# Patient Record
Sex: Female | Born: 1965 | Race: Black or African American | Hispanic: No | Smoking: Former smoker
Health system: Southern US, Community
[De-identification: ages and names within clinical notes are randomized; demographics above are authoritative.]

## PROBLEM LIST (undated history)

## (undated) DIAGNOSIS — M349 Systemic sclerosis, unspecified: Secondary | ICD-10-CM

## (undated) HISTORY — DX: Systemic sclerosis, unspecified: M34.9

---

## 2003-03-18 ENCOUNTER — Ambulatory Visit (HOSPITAL_COMMUNITY): Admission: RE | Admit: 2003-03-18 | Discharge: 2003-03-19 | Payer: Self-pay | Admitting: Orthopaedic Surgery

## 2003-04-10 HISTORY — PX: CERVICAL DISC SURGERY: SHX588

## 2006-06-29 ENCOUNTER — Emergency Department (HOSPITAL_COMMUNITY): Admission: EM | Admit: 2006-06-29 | Discharge: 2006-06-29 | Payer: Self-pay | Admitting: Emergency Medicine

## 2008-12-22 ENCOUNTER — Other Ambulatory Visit: Admission: RE | Admit: 2008-12-22 | Discharge: 2008-12-22 | Payer: Self-pay | Admitting: Family Medicine

## 2008-12-22 ENCOUNTER — Ambulatory Visit: Payer: Self-pay | Admitting: Family Medicine

## 2008-12-22 ENCOUNTER — Encounter: Payer: Self-pay | Admitting: Family Medicine

## 2008-12-22 DIAGNOSIS — M349 Systemic sclerosis, unspecified: Secondary | ICD-10-CM

## 2008-12-22 LAB — HM PAP SMEAR

## 2008-12-23 LAB — CONVERTED CEMR LAB: Pap Smear: NORMAL

## 2009-01-03 ENCOUNTER — Encounter: Payer: Self-pay | Admitting: Family Medicine

## 2009-01-03 ENCOUNTER — Encounter: Admission: RE | Admit: 2009-01-03 | Discharge: 2009-01-03 | Payer: Self-pay | Admitting: Family Medicine

## 2009-01-03 LAB — HM MAMMOGRAPHY: HM Mammogram: NEGATIVE

## 2009-01-04 LAB — CONVERTED CEMR LAB
ALT: 16 units/L (ref 0–35)
AST: 16 units/L (ref 0–37)
Albumin: 4.2 g/dL (ref 3.5–5.2)
Alkaline Phosphatase: 71 units/L (ref 39–117)
BUN: 16 mg/dL (ref 6–23)
CO2: 20 meq/L (ref 19–32)
Calcium: 9.1 mg/dL (ref 8.4–10.5)
Chloride: 110 meq/L (ref 96–112)
Cholesterol: 165 mg/dL (ref 0–200)
Creatinine, Ser: 0.8 mg/dL (ref 0.40–1.20)
Glucose, Bld: 96 mg/dL (ref 70–99)
HDL: 61 mg/dL (ref >39)
LDL Cholesterol: 95 mg/dL (ref 0–99)
Pap Smear: NORMAL
Potassium: 4.5 meq/L (ref 3.5–5.3)
Sodium: 141 meq/L (ref 135–145)
TSH: 1.21 microintl units/mL (ref 0.350–4.500)
Total Bilirubin: 0.5 mg/dL (ref 0.3–1.2)
Total CHOL/HDL Ratio: 2.7
Total Protein: 7.2 g/dL (ref 6.0–8.3)
Triglycerides: 46 mg/dL (ref <150)
VLDL: 9 mg/dL (ref 0–40)

## 2009-08-10 ENCOUNTER — Ambulatory Visit: Payer: Self-pay | Admitting: Family Medicine

## 2009-08-10 DIAGNOSIS — L301 Dyshidrosis [pompholyx]: Secondary | ICD-10-CM | POA: Insufficient documentation

## 2009-08-11 LAB — CONVERTED CEMR LAB
ALT: 17 units/L (ref 0–35)
AST: 16 units/L (ref 0–37)
Albumin: 3.8 g/dL (ref 3.5–5.2)
Alkaline Phosphatase: 63 units/L (ref 39–117)
BUN: 19 mg/dL (ref 6–23)
Basophils Absolute: 0 10*3/uL (ref 0.0–0.1)
Basophils Relative: 0 % (ref 0–1)
CO2: 23 meq/L (ref 19–32)
Calcium: 8.9 mg/dL (ref 8.4–10.5)
Chloride: 108 meq/L (ref 96–112)
Creatinine, Ser: 0.84 mg/dL (ref 0.40–1.20)
Eosinophils Absolute: 0.1 10*3/uL (ref 0.0–0.7)
Eosinophils Relative: 1 % (ref 0–5)
Glucose, Bld: 80 mg/dL (ref 70–99)
HCT: 45.6 % (ref 36.0–46.0)
Hemoglobin: 15 g/dL (ref 12.0–15.0)
INR: 1.09 (ref <1.50)
Lymphocytes Relative: 26 % (ref 12–46)
Lymphs Abs: 2.2 10*3/uL (ref 0.7–4.0)
MCHC: 32.9 g/dL (ref 30.0–36.0)
MCV: 89.8 fL (ref 78.0–100.0)
Monocytes Absolute: 0.8 10*3/uL (ref 0.1–1.0)
Monocytes Relative: 9 % (ref 3–12)
Neutro Abs: 5.4 10*3/uL (ref 1.7–7.7)
Neutrophils Relative %: 64 % (ref 43–77)
Platelets: 246 10*3/uL (ref 150–400)
Potassium: 3.9 meq/L (ref 3.5–5.3)
Prothrombin Time: 14 s (ref 11.6–15.2)
RBC: 5.08 M/uL (ref 3.87–5.11)
RDW: 14.1 % (ref 11.5–15.5)
Sodium: 140 meq/L (ref 135–145)
TSH: 0.9 microintl units/mL (ref 0.350–4.500)
Total Bilirubin: 0.4 mg/dL (ref 0.3–1.2)
Total Protein: 6.6 g/dL (ref 6.0–8.3)
WBC: 8.5 10*3/uL (ref 4.0–10.5)

## 2009-08-18 ENCOUNTER — Encounter: Payer: Self-pay | Admitting: Family Medicine

## 2010-01-09 ENCOUNTER — Ambulatory Visit: Payer: Self-pay | Admitting: Family Medicine

## 2010-01-09 DIAGNOSIS — F430 Acute stress reaction: Secondary | ICD-10-CM | POA: Insufficient documentation

## 2010-01-09 DIAGNOSIS — M79609 Pain in unspecified limb: Secondary | ICD-10-CM | POA: Insufficient documentation

## 2010-01-10 ENCOUNTER — Encounter: Payer: Self-pay | Admitting: Family Medicine

## 2010-01-18 ENCOUNTER — Ambulatory Visit: Payer: Self-pay

## 2010-01-18 ENCOUNTER — Ambulatory Visit: Payer: Self-pay | Admitting: Cardiology

## 2010-02-20 ENCOUNTER — Ambulatory Visit: Payer: Self-pay | Admitting: Family Medicine

## 2010-04-20 ENCOUNTER — Ambulatory Visit
Admission: RE | Admit: 2010-04-20 | Discharge: 2010-04-20 | Payer: Self-pay | Source: Home / Self Care | Attending: Family Medicine | Admitting: Family Medicine

## 2010-04-26 ENCOUNTER — Encounter: Payer: Self-pay | Admitting: Family Medicine

## 2010-04-28 ENCOUNTER — Ambulatory Visit
Admission: RE | Admit: 2010-04-28 | Discharge: 2010-04-28 | Payer: Self-pay | Source: Home / Self Care | Attending: Internal Medicine | Admitting: Internal Medicine

## 2010-05-01 ENCOUNTER — Encounter: Payer: Self-pay | Admitting: Internal Medicine

## 2010-05-02 ENCOUNTER — Ambulatory Visit (HOSPITAL_COMMUNITY): Admission: RE | Admit: 2010-05-02 | Payer: Self-pay | Source: Home / Self Care | Admitting: Rheumatology

## 2010-05-02 ENCOUNTER — Ambulatory Visit: Admit: 2010-05-02 | Payer: Self-pay

## 2010-05-08 ENCOUNTER — Encounter: Payer: Self-pay | Admitting: Family Medicine

## 2010-05-09 NOTE — Assessment & Plan Note (Signed)
Summary: follow-up mood   Vital Signs:  Patient profile:   44 year old female Height:      66 inches Weight:      171 pounds BMI:     27.70 Pulse rate:   61 / minute BP sitting:   117 / 72  (right arm) Cuff size:   regular  Vitals Entered By: Avon Gully CMA, Duncan Dull) (February 20, 2010 8:33 AM) CC: f/u mood, feels much better   Primary Care Provider:  Nani Gasser MD  CC:  f/u mood and feels much better.  History of Present Illness: Has been donig well on the citalopram.   Much less stressed. anger is much better.  Sleeping well overall. Hasn't had to use the benzo at all. No SE of the medication.Says her mom and coworkers have really noticed a difference. No change in her appetite.   Current Medications (verified): 1)  Triamcinolone Acetonide 0.5 % Oint (Triamcinolone Acetonide) .... Apply To Fingertip 1-2 X A Day 2)  Citalopram Hydrobromide 20 Mg Tabs (Citalopram Hydrobromide) .... 1/2 Tab By Mouth Daily For One Week, Then Inc To 1 Tab Daily 3)  Klonopin 0.5 Mg Tabs (Clonazepam) .... 1/2 To 1 Tab Once Daily Tab As Needed For Anxiety  Allergies (verified): No Known Drug Allergies  Comments:  Nurse/Medical Assistant: The patient's medications and allergies were reviewed with the patient and were updated in the Medication and Allergy Lists. Avon Gully CMA, Duncan Dull) (February 20, 2010 8:34 AM)  Physical Exam  General:  Well-developed,well-nourished,in no acute distress; alert,appropriate and cooperative throughout examination Lungs:  Normal respiratory effort, chest expands symmetrically. Lungs are clear to auscultation, no crackles or wheezes. Heart:  Normal rate and regular rhythm. S1 and S2 normal without gallop, murmur, click, rub or other extra sounds. Skin:  no rashes.   Psych:  Cognition and judgment appear intact. Alert and cooperative with normal attention span and concentration. No apparent delusions, illusions, hallucinations   Impression &  Recommendations:  Problem # 1:  STRESS REACTION, ACUTE (ICD-308.9) Doing very very well.  Continue current regimen.   Call if any concernes F/U in 2-3 months. Reminded her med has to be weaned if she decides to discontinue it.   Complete Medication List: 1)  Triamcinolone Acetonide 0.5 % Oint (Triamcinolone acetonide) .... Apply to fingertip 1-2 x a day 2)  Citalopram Hydrobromide 20 Mg Tabs (Citalopram hydrobromide) .... Take 1 tablet by mouth once a day 3)  Klonopin 0.5 Mg Tabs (Clonazepam) .... 1/2 to 1 tab once daily tab as needed for anxiety  Patient Instructions: 1)  Please schedule a follow-up appointment in 2-3  months for mood.   Contraindications/Deferment of Procedures/Staging:    Test/Procedure: FLU VAX    Reason for deferment: patient declined  Prescriptions: CITALOPRAM HYDROBROMIDE 20 MG TABS (CITALOPRAM HYDROBROMIDE) Take 1 tablet by mouth once a day  #90 x 0   Entered and Authorized by:   Nani Gasser MD   Signed by:   Nani Gasser MD on 02/20/2010   Method used:   Electronically to        [facility]. [phone]* (retail)       [phone]       [place], [place]  [address]       Ph: [phone]       Fax: [phone]   RxID:   [id number]    Orders Added: 1)  Est. Patient Level III [02725]

## 2010-05-09 NOTE — Assessment & Plan Note (Signed)
Summary: Lipoma, eczema, nosebleeds, wants podiatry referrall.    Vital Signs:  Patient profile:   45 year old female Height:      66 inches Weight:      176 pounds Pulse rate:   87 / minute BP sitting:   113 / 62  (left arm) Cuff size:   regular  Vitals Entered By: Kathlene November (Aug 10, 2009 8:36 AM) CC: ?cyst on back   Primary Care Provider:  Nani Gasser MD  CC:  ?cyst on back.  History of Present Illness: ?cyst on back. Noticed it several months ago.  Not painful or otherwise bothersome.   Started with nosebleeds about 2 months ago. NO HA or nasal pain or sinus congestions or fever.  No nose trauma.   Upper arms have been dry and burning.  Has an infection in her first finger right hand. Says she soaked it in peroxide and is now better but still healing. She has been putting neosporin on it. Marland Kitchen Up 3 lbs in the last 6 months. Had normal thyroid level in September. She is a smoker.    Current Medications (verified): 1)  None  Allergies (verified): No Known Drug Allergies  Comments:  Nurse/Medical Assistant: The patient's medications and allergies were reviewed with the patient and were updated in the Medication and Allergy Lists. Kathlene November (Aug 10, 2009 8:36 AM)  Social History: Reviewed history from 12/22/2008 and no changes required. 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  14 yrs education. LIves with her mother.   Current Smoker Alcohol use-yes Drug use-no Regular exercise-no  Physical Exam  General:  Well-developed,well-nourished,in no acute distress; alert,appropriate and cooperative throughout examination Head:  Normocephalic and atraumatic without obvious abnormalities. No apparent alopecia or balding. Lungs:  Normal respiratory effort, chest expands symmetrically. Lungs are clear to auscultation, no crackles or wheezes. Heart:  Normal rate and regular rhythm. S1 and S2 normal without gallop, murmur, click, rub or other extra sounds. Skin:   Right upper back with 6 cm lipoma. Nontender and no rash.  Dry peeling skin on her fingertips.  Right hand frist finger with cracking and a scab.  Appears to be healing well.  Psych:  Cognition and judgment appear intact. Alert and cooperative with normal attention span and concentration. No apparent delusions, illusions, hallucinations   Impression & Recommendations:  Problem # 1:  LIPOMA (ICD-214.9) GAve reassurance.  Discussed options or watching it or scheudle for General surgery for removal.  Pt wants to think about it.  Told her to call me if wants the referral.   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 Call if not better in 2-3 weeks.   Problem # 3:  NOSEBLEED (ICD-784.7) Will get labs to rule out any underlying liver d/o or elevated PT or white count.  Orders: T-CBC w/Diff [id number]) T-TSH [id number]) T-Comprehensive Metabolic Panel [id number]) T-Protime, Auto ([id number])  Complete Medication List: 1)  Triamcinolone Acetonide 0.5 % Oint (Triamcinolone acetonide) .... Apply to fingertip 1-2 x a day  Other Orders: Podiatry Referral (Podiatry)  Patient Instructions: 1)  We will call you with your lab results and with your referral to Podiatry.  Prescriptions: TRIAMCINOLONE ACETONIDE 0.5 % OINT (TRIAMCINOLONE ACETONIDE) Apply to fingertip 1-2 x a day  #40 grams. x 1   Entered and Authorized by:   Nani Gasser MD   Signed by:   Nani Gasser MD on 08/10/2009   Method used:   Electronically  to        [facility]. [phone]* (retail)       [phone]       [place], [place]  [address]       Ph: [phone]       Fax: [phone]   RxID:   [id number]

## 2010-05-09 NOTE — Assessment & Plan Note (Signed)
Summary: Mood, stress, Neck and arm pain   Vital Signs:  Patient profile:   45 year old female Height:      66 inches Weight:      176 pounds Pulse rate:   69 / minute BP sitting:   97 / 53  (right arm) Cuff size:   regular  Vitals Entered By: Avon Gully CMA, Duncan Dull) (January 09, 2010 11:14 AM)  Contraindications/Deferment of Procedures/Staging:    Test/Procedure: FLU VAX    Reason for deferment: patient declined  CC: nosebleed and spit up blood, HA's every day during the month of aug.stressed at work   Primary Care [place] Hearty:  Nani Gasser MD  CC:  nosebleed and spit up blood and HA's every day during the month of aug.stressed at work.  History of Present Illness: nosebleed and spit up blood, HA's every day during the month of aug.stressed at work.  Last week had a severe pain in her left side of her neck and radiates down into her left arm. Stopped the car and bought a bottle of Aleve and that seemed to help it. Says it lasted about 10 minutes or so, but not sure.  Has never happened before. Has not happened since.  No chest pain or diaphoresis at that time.  Feels less mentally clear. Having nosebleeds only on the left nostril. Can hapen anytime.  No warning. Can happen at rest. Maybe happening a few times a month. Had a HA the whole month of August. No HA the month of September.  Period has been irregular the last few months.  No weight changes.  Says she is feeling very stressed at work. Having a hard time really dealing with the stressors. Says she doesn't feel down or particularly anxious, just overwhelmed. She says she knows ultimatley needs to find a new job.  Says even her mother 2 months ago told her she was "High-strung". Says she has really thought about it and thinks she probably is.   Current Medications (verified): 1)  Triamcinolone Acetonide 0.5 % Oint (Triamcinolone Acetonide) .... Apply To Fingertip 1-2 X A Day  Allergies (verified): No Known Drug  Allergies  Comments:  Nurse/Medical Assistant: The patient's medications and allergies were reviewed with the patient and were updated in the Medication and Allergy Lists. Avon Gully CMA, Duncan Dull) (January 09, 2010 11:15 AM)  Past History:  Past Medical History: Last updated: 12/22/2008 Dr. 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  14 yrs education. LIves with her mother.   Current Smoker Alcohol use-yes Drug use-no Regular exercise-no  Family History: Mother with BrCA Father with died  MI in his early 82s,  GM with DM  Physical Exam  General:  Well-developed,well-nourished,in no acute distress; alert,appropriate and cooperative throughout examination Head:  Normocephalic and atraumatic without obvious abnormalities. No apparent alopecia or balding. Eyes:  No corneal or conjunctival inflammation noted. EOMI. Perrla.  Ears:  External ear exam shows no significant lesions or deformities.  Otoscopic examination reveals clear canals, tympanic membranes are intact bilaterally without bulging, retraction, inflammation or discharge. Hearing is grossly normal bilaterally. Nose:  External nasal examination shows no deformity or inflammation. Nasal mucosa are pink and moist without lesions or exudates. Mouth:  Oral mucosa and oropharynx without lesions or exudates.  Teeth in good repair. Neck:  No deformities, masses, or tenderness noted. Lungs:  Normal respiratory effort, chest  expands symmetrically. Lungs are clear to auscultation, no crackles or wheezes. Heart:  Normal rate and regular rhythm. S1 and S2 normal without gallop, murmur, click, rub or other extra sounds. Pulses:  Radial 2  Neurologic:  alert & oriented X3, cranial nerves II-XII intact, and gait normal.   Skin:  no rashes.   Cervical Nodes:  No lymphadenopathy  noted Psych:  Cognition and judgment appear intact. Alert and cooperative with normal attention span and concentration. No apparent delusions, illusions, hallucinations   Impression & Recommendations:  Problem # 1:  NOSEBLEED (ICD-784.7)  No lesions on exam. Labs in May were normal. Lets refer to ENT since nosebleds are only from the left nostril to rule out a lesion that may be bleeding.  She doesn' feel her tissues are dry at all.   Orders: ENT Referral (ENT)  Problem # 2:  ISCHEMIC HEART DISEASE, PREMATURE, FAMILY HX (ICD-V17.3)  With arm Pain and family hx of preamature heart dz will schedule for stress test.  EKG shows NSR, rate 60 bpm, No acute changes  Will refer for stress testing.   Dsicused the need for smoking cessation. she is agrrees but is not ready to quit.  Orders: T-Treadmill interpretation and report only (93716) EKG w/ Interpretation (93000)  Problem # 3:  STRESS REACTION, ACUTE (ICD-308.9) Discussed tx with counseling and medications. Discussed SSRI and benzos. Discussed potential SE and addicitve potential for benzos. Discussed onm ly using benzos as a rescue medication.  F/U in 1 months.   Complete Medication List: 1)  Triamcinolone Acetonide 0.5 % Oint (Triamcinolone acetonide) .... Apply to fingertip 1-2 x a day 2)  Citalopram Hydrobromide 20 Mg Tabs (Citalopram hydrobromide) .... 1/2 tab by mouth daily for one week, then inc to 1 tab daily 3)  Klonopin 0.5 Mg Tabs (Clonazepam) .... 1/2 to 1 tab once daily tab as needed for anxiety  Patient Instructions: 1)  Start citalpram for mood.  Can cause mild Headaches and shakiness the first week. Usually resolves after first 1-2 weeks 2)  Can use the clonazepam as a rescue medicine when feeling overwhelmed.  3)  Will refer for a stress test. 4)  Please schedule a follow-up appointment in 1 month.  Prescriptions: CITALOPRAM HYDROBROMIDE 20 MG TABS (CITALOPRAM HYDROBROMIDE) 1/2 tab by mouth daily for one week, then  inc to 1 tab daily  #30 x 0   Entered and Authorized by:   Nani Gasser MD   Signed by:   Nani Gasser MD on 01/09/2010   Method used:   Electronically to        [facility] [phone]* (retail)       [address]       [place], [place]  [address]       Ph: [phone]       Fax: [phone]   RxID:   [id number] TRIAMCINOLONE ACETONIDE 0.5 % OINT (TRIAMCINOLONE ACETONIDE) Apply to fingertip 1-2 x a day  #40 grams. x 1   Entered and Authorized by:   Nani Gasser MD   Signed by:   Nani Gasser MD on 01/09/2010   Method used:   Electronically to        [facility] [phone]* (retail)       [address] [place], [place]  [address]       Ph: [phone]       Fax: [phone]   RxID:   [id number] KLONOPIN 0.5 MG  TABS (CLONAZEPAM) 1/2 to 1 tab once daily tab as needed for anxiety  #15 x 0   Entered and Authorized by:   Nani Gasser MD   Signed by:   Nani Gasser MD on 01/09/2010   Method used:   Printed then faxed to ...       [facility]. [phone]* (retail)       [phone] [address]       [place], [place]  [address]       Ph: [phone]       Fax: [phone]   RxID:   [id number] CITALOPRAM HYDROBROMIDE 20 MG TABS (CITALOPRAM HYDROBROMIDE) 1/2 tab by mouth daily for one week, then inc to 1 tab daily  #30 x 0   Entered and Authorized by:   Nani Gasser MD   Signed by:   Nani Gasser MD on 01/09/2010   Method used:   Electronically to        [facility]. [phone]* (retail)       [phone] [address]       [place], [place]  [address]       Ph: [phone]       Fax: [phone]   RxID:   [id number]

## 2010-05-09 NOTE — Consult Note (Signed)
Summary: [facility]  [facility]   Imported By: Lanelle Bal 09/30/2009 08:33:17  _____________________________________________________________________  External Attachment:    Type:   Image     Comment:   External Document

## 2010-05-09 NOTE — Consult Note (Signed)
Summary: [facility]  [facility]   Imported By: Lanelle Bal 01/27/2010 14:49:40  _____________________________________________________________________  External Attachment:    Type:   Image     Comment:   External Document

## 2010-05-10 ENCOUNTER — Other Ambulatory Visit (HOSPITAL_COMMUNITY): Payer: Self-pay

## 2010-05-11 NOTE — Miscellaneous (Signed)
Summary: Orders Update pft charges   Clinical Lists Changes  Orders: Added new Service order of Carbon Monoxide diffusing w/capacity (94729) - Signed Added new Service order of Lung Volumes/Gas dilution or washout (94727) - Signed Added new Service order of Spirometry (Pre & Post) (94060) - Signed 

## 2010-05-11 NOTE — Assessment & Plan Note (Signed)
Summary: Stress reaction, scleroderma   Vital Signs:  Patient profile:   45 year old female Height:      66 inches Weight:      178 pounds Pulse rate:   68 / minute BP sitting:   123 / 74  (right arm) Cuff size:   regular  Vitals Entered By: Avon Gully CMA, Duncan Dull) (April 20, 2010 4:16 PM) CC: Note for seatbelt, hands peeling, needs chair for work   Primary Care Provider:  Nani Gasser MD  CC:  Note for seatbelt, hands peeling, and needs chair for work.  History of Present Illness: Note for seatbelt, hands peeling, needs chair for work.  Needs an ergonomic chair for work. Current chair causes thigh, buttock and low back discomfort. She does have a hx of scleroderma that primarly affects her hands and feet. She has been so frustrated with her current chair she went out and bought her own.    Ran out of the citalopram and just stopped it. Says even though she was tolerating it well she thinks she is fine on it.   She says when she wears a seatbelt she feels claustrophobic so she just doesn't wear it and gets tickets.  She wants a note sayting she doesn't have to wear her seatbelt.   Allergies: No Known Drug Allergies  Physical Exam  General:  Well-developed,well-nourished,in no acute distress; alert,appropriate and cooperative throughout examination Lungs:  Normal respiratory effort, chest expands symmetrically. Lungs are clear to auscultation, no crackles or wheezes. Heart:  Normal rate and regular rhythm. S1 and S2 normal without gallop, murmur, click, rub or other extra sounds. Skin:  Her fingers looks tight and shiney. She has cracks at fingertips.    Impression & Recommendations:  Problem # 1:  STRESS REACTION, ACUTE (ICD-308.9) She feels she is doing well off the citalopram and would like to stay off of it.  I am not personally convinced of these as she almost started crying several times during the office visit but I will respect her wishes. Also as far  as the seatbelt is concerned I explained that I will not sign s letter stating she doens't have to wear her seatbelt as I think this is unsafe and if this is really a phobia she could get treatment for this.  Pt says she refuses treatment and would rather save her money for tickets instead.   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 Hs been using her steroid cream. I recommend she let us make a referral for her for specialty consult.  I will fill out the paperwork for her chair for work.  Orders: Rheumatology Referral (Rheumatology)  Complete Medication List: 1)  Triamcinolone Acetonide 0.5 % Oint (Triamcinolone acetonide) .... Apply to fingertip 1-2 x a day 2)  Klonopin 0.5 Mg Tabs (Clonazepam) .... 1/2 to 1 tab once daily tab as needed for anxiety  Patient Instructions: 1)  We will call you with the referral and we will call you when we fax the paperwork over.    Orders Added: 1)  Rheumatology Referral [Rheumatology] 2)  Est. Patient Level III [62952]

## 2010-05-17 NOTE — Letter (Signed)
Summary: Generic Letter  [facility]  [address]   [place], [place] [address]   Phone: [phone]  Fax: [phone]    05/08/2010  In regards to:  Kristen Fletcher [address] [place], [place]  [address]     Ms. Turner needs an ergonomic chair for work.  She has a diagnosis of scleroderma that affects the blood flow to her hands and feet and since she has to sit for long periods of time at her desk please consider accomodating her with a more comfortable and ergonomic chair to promotes healhthy blood flow to her extremities. Please call if you have any further questions.     Sincerely,   Nani Gasser MD

## 2010-05-18 ENCOUNTER — Ambulatory Visit: Payer: Self-pay | Admitting: Cardiology

## 2010-05-25 NOTE — Consult Note (Signed)
Summary: [facility]   Imported By: Maryln Gottron 05/18/2010 13:50:28  _____________________________________________________________________  External Attachment:    Type:   Image     Comment:   External Document

## 2010-06-08 ENCOUNTER — Encounter (INDEPENDENT_AMBULATORY_CARE_PROVIDER_SITE_OTHER): Payer: Self-pay | Admitting: *Deleted

## 2010-06-08 ENCOUNTER — Institutional Professional Consult (permissible substitution): Payer: Self-pay | Admitting: Internal Medicine

## 2010-06-15 NOTE — Letter (Signed)
Summary: Appointment - Missed  [facility]  [address]   [place], [place] [address]   Phone: [phone]  Fax: [phone]         June 08, 2010 MRN: [id number]      Kristen Fletcher [address] [place], [place]  [address]     Dear Ms. Kristen Fletcher,   Our records indicate you missed your appointment on May 18, 2010 at 2:30pm with Dr. Shirlee Latch. It is very important that we reach you to reschedule this appointment. We look forward to participating in your health care needs. Please contact us at the number listed above at your earliest convenience to reschedule this appointment.     Sincerely,  Roe Coombs  [employer] Scheduling Team

## 2010-07-11 ENCOUNTER — Encounter: Payer: Self-pay | Admitting: Cardiology

## 2010-07-27 ENCOUNTER — Encounter: Payer: Self-pay | Admitting: Family Medicine

## 2010-07-28 ENCOUNTER — Encounter: Payer: 59 | Admitting: Family Medicine

## 2010-08-25 ENCOUNTER — Encounter: Payer: Self-pay | Admitting: Family Medicine

## 2010-08-25 ENCOUNTER — Ambulatory Visit (INDEPENDENT_AMBULATORY_CARE_PROVIDER_SITE_OTHER): Payer: 59 | Admitting: Family Medicine

## 2010-08-25 ENCOUNTER — Other Ambulatory Visit (HOSPITAL_COMMUNITY)
Admission: RE | Admit: 2010-08-25 | Discharge: 2010-08-25 | Disposition: A | Discharge status: Home / Self Care | Payer: 59 | Source: Ambulatory Visit | Attending: Family Medicine | Admitting: Family Medicine

## 2010-08-25 VITALS — BP 112/70 | HR 103 | Ht 66.0 in | Wt 176.0 lb

## 2010-08-25 DIAGNOSIS — Z1159 Encounter for screening for other viral diseases: Secondary | ICD-10-CM | POA: Insufficient documentation

## 2010-08-25 DIAGNOSIS — Z0000 Encounter for general adult medical examination without abnormal findings: Secondary | ICD-10-CM

## 2010-08-25 DIAGNOSIS — Z01419 Encounter for gynecological examination (general) (routine) without abnormal findings: Secondary | ICD-10-CM | POA: Insufficient documentation

## 2010-08-25 MED ORDER — AMMONIUM LACTATE 12 % EX CREA: TOPICAL_CREAM | CUTANEOUS | Status: AC | PRN

## 2010-08-25 NOTE — Op Note (Signed)
NAME:  Kristen Fletcher, Kristen Fletcher                            ACCOUNT NO.:  [id number]   MEDICAL RECORD NO.:  [id number]                   PATIENT TYPE:  OIB   LOCATION:  2899                                 FACILITY:  [facility]   PHYSICIAN:  Sharolyn Douglas, M.D.                     DATE OF BIRTH:  09-Mar-1966   DATE OF PROCEDURE:  03/18/2003  DATE OF DISCHARGE:                                 OPERATIVE REPORT   PREOPERATIVE DIAGNOSIS:  Cervical spondylotic myeloradiculopathy.   PROCEDURE:  1. Anterior cervical diskectomy C4-5 with decompression of the spinal cord     and nerve roots bilaterally.  2. Anterior cervical arthrodesis C4-5 with placement of an 8 mm Nuvasive     allograft prosthesis spacer packed with local autogenous bone graft.  3. Anterior cervical plating utilizing the Trinica System C4-5.   SURGEON:  Sharolyn Douglas, M.D.   ASSISTANT:  Verlin Fester, P.A.   ANESTHESIA:  General endotracheal.   COMPLICATIONS:  None.   INDICATIONS FOR PROCEDURE:  The patient is a 44 year old female with  persistent, severe neck and bilateral upper extremity pain, left greater  than right.  She has failed all conservative treatment modalities.  Her  plain radiograph showed degenerative changes most pronounced at C4-5.   Her MRI scan demonstrates paracentral disk osteophyte complex at C4-5 which  flattens the anterior cervical spinal cord and narrows the AP diameter to 7  mm.  Because of her persistent symptoms, she has elected to undergo ACDF at  C4-5 in hopes of improving her symptoms.  Risks, benefits, and alternatives  were extensively discussed.  She elected to proceed.   DESCRIPTION OF PROCEDURE:  The patient was properly identified in the  holding area and taken to the operating room.  She underwent general  endotracheal anesthesia without difficulty.  She was given prophylactic IV  antibiotics.  She was carefully positioned on the [place] head rest with  her neck in slight extension.  All bony  prominences were padded.  The neck  was prepped and draped in the usual sterile fashion.  A 3.5 cm incision was  made on the left side in the natural skin crease at the level of the thyroid  cartilage.  Dissection was carried sharply through the platysma.  The  anterior wall between the SCM and strap muscles medially was developed down  to the prevertebral space.  The esophagus, trachea, and carotid sheath were  identified and protected at all times.  The C4-5 level was identified by the  anterior osteophytes.  A spinal needle was placed.  X-ray was taken to  confirm this location.  We then elevated the longus coli muscle out over the  C4-5 disk space bilaterally.  Deep shadowline retractor placed.  Diskectomy  was carried out back to the posterior longitudinal ligament.  Caspar  distraction pins were placed and gentle distraction placed across the  C4-5  level.  Uncovertebral hypertrophy was noted.  We used the high speed bur to  remove the cartilaginous end plates and taken down the uncovertebral spurs  as well as the posterior vertebral margins.  The inferior 1/3 of the C3  vertebral body was taken down to allow access to the disk osteophyte complex  projecting behind the body.  The vertebral margins were undercut using a 2  mm Kerrison.  The posterior longitudinal ligament was taken down.  The  thecal sac was identified and decompressed.  Foraminotomies were carried  out.  The wound was irrigated.  Bleeding was controlled with bipolar  electrocautery and Gelfoam.  8 mm Nuvasive allograft prosthesis spacer was  then packed with local autogenous bone graft collected from the bur shavings  and carefully impacted and recessed 2 mm from the inferior vertebral margin.  We confirmed there was space posteriorly with the blunt probe.  We then  placed a 24 mm Trinica plate with four 14 mm variable screws.  We ensured  the locking mechanism engaged.  The screws had excellent purchase.  The   esophagus, trachea, carotid sheath examined and there were no apparent  injuries.  X-ray taken showing good position of the plate, screws, and  interbody graft.  Deep Penrose drain placed.  Platysma closed with 2-0  Vicryl suture followed by 3-0 Vicryl in the subcutaneous layer and then a  running 4-0 subcuticular Vicryl to approximate the skin edges.  Benzoin and  Steri-Strips were placed.  A sterile dressing applied.  Soft, cervical  collar was placed.  The patient was extubated without difficulty,  transferred to the recovery room in stable condition able to move her upper  and lower extremities.                                               Sharolyn Douglas, M.D.    MC/MEDQ  D:  03/18/2003  T:  03/19/2003  Job:  [id number]

## 2010-08-25 NOTE — H&P (Signed)
NAME:  Kristen Fletcher, Kristen Fletcher                            ACCOUNT NO.:  [id number]   MEDICAL RECORD NO.:  [id number]                   PATIENT TYPE:  OIB   LOCATION:  NA                                   FACILITY:  [facility]   PHYSICIAN:  Sharolyn Douglas, M.D.                     DATE OF BIRTH:  [birth year]   DATE OF ADMISSION:  03/18/2003  DATE OF DISCHARGE:                                HISTORY & PHYSICAL   CHIEF COMPLAINT:  Bilateral upper extremity paresthesias and numbness.   HISTORY OF PRESENT ILLNESS:  The patient is a 45 year old female who has  been having neck pain and bilateral upper extremity pain as well as  paresthesias and numbness that has been going on since earlier this year.  Unfortunately, she has not seen any improvement with conservative  treatments.  MRI shows cervical myeloradiculopathy secondary to an  osteophyte complex at C4-5.  Secondary to these findings as well as her  failure to improve with conservative measures, it was thought that her best  course of management would be an ACDF at C4-5.  Risks and benefits of this  procedure were discussed with the patient by Dr. Noel Gerold as well as myself.  She indicated understanding and opted to proceed.   ALLERGIES:  No known drug allergies.   MEDICATIONS:  1. Vicodin p.r.n.  2. Weight Smart supplement.   PAST MEDICAL HISTORY:  Healthy.   PAST SURGICAL HISTORY:  Abdominal laparoscopy for endometriosis.   SOCIAL HISTORY:  The patient smokes 10 cigarettes per day.  She drinks  alcohol on a social basis.  She is divorced, has no children.  Her family  will be able to help her postoperatively.   FAMILY HISTORY:  Significant for diabetes, hypertension, and also a father  who died in his 52s secondary to a MI.   REVIEW OF SYSTEMS:  The patient denies fevers, chills, sweats, or bleeding  tendencies.  CNS:  Denies blurry vision, double vision, seizures, headaches,  or paralysis.  CARDIOVASCULAR:  Denies chest pain, angina,  orthopnea,  claudication, palpitations.  PULMONARY:  Denies shortness of breath,  productive cough, or hemoptysis.  GASTROINTESTINAL:  Denies nausea,  vomiting, diarrhea, constipation, melena, bloody stools.  GENITOURINARY:  Denies dysuria, hematuria, or discharge.  MUSCULOSKELETAL:  As per HPI.   PHYSICAL EXAMINATION:  VITAL SIGNS:  Pulse is 80 and regular, respirations  are 16 and unlabored, blood pressure is 136/82.  GENERAL APPEARANCE:  The patient is a 45 year old black female who is alert  and oriented, in no acute distress.  Well-nourished, well-groomed, appears  her stated age.  Pleasant and cooperative to the exam.  HEENT:  Head is normocephalic, atraumatic.  Pupils equal, round, reactive to  light.  Extraocular movements were intact.  Nares patent.  Pharynx clear.  NECK:  Soft to palpation, no lymphadenopathy, thyromegaly, or bruits were  appreciated.  CHEST:  Clear to auscultation bilaterally.  No rales, rhonchi, stridor, or  friction rubs present.  BREASTS:  Not pertinent.  HEART:  S1 and S2 regular rate and rhythm with no murmurs, rubs, or gallops  noted.  ABDOMEN:  Soft to palpation, nontender, nondistended, no organomegaly noted.  Positive bowel sounds throughout.  GENITOURINARY:  Not pertinent.  EXTREMITIES:  The patient has bilateral upper extremity paresthesias and  numbness, as well as neck pain.  Motor function and sensation are grossly  intact.  Pulses are intact and symmetric bilaterally.  SKIN:  Intact without any rashes or lesions.   LABORATORY DATA:  X-ray and MRI show cervical spinal stenosis and HNP at C4-  5.   IMPRESSION:  Spinal stenosis and herniated nucleus pulposus, C4-5.   PLAN:  Admit to [facility] 18, [facility], for an ACDF at C4-  5.  This will be done by Dr. Sharolyn Douglas.  The patient has no primary care  physician.      Verlin Fester, P.A.                       Sharolyn Douglas, M.D.    CM/MEDQ  D:  03/17/2003  T:  03/17/2003   Job:  [id number]

## 2010-08-25 NOTE — Progress Notes (Signed)
  Subjective:     Kristen Fletcher is a 45 y.o. female and is here for a comprehensive physical exam. The patient reports no problems.  History   Social History  . Marital Status: Divorced    Spouse Name: N/A    Number of Children: N/A  . Years of Education: N/A   Occupational History  . works form home    Social History Main Topics  . Smoking status: Current Everyday Smoker -- 0.5 packs/day    Types: Cigarettes  . Smokeless tobacco: Not on file  . Alcohol Use: Yes  . Drug Use: No  . Sexually Active: Not on file   Other Topics Concern  . Not on file   Social History Narrative  . No narrative on file   Health Maintenance  Topic Date Due  . Tetanus/tdap  09/05/1982  . Pap Smear  12/23/2011    The following portions of the patient's history were reviewed and updated as appropriate: allergies, current medications, past family history, past medical history, past social history and past surgical history.  Review of Systems A comprehensive review of systems was negative.   Objective:    BP 112/70  Pulse 103  Ht 5\' 6"  (1.676 m)  Wt 176 lb (79.833 kg)  BMI 28.41 kg/m2 General appearance: alert and cooperative Head: Normocephalic, without obvious abnormality, atraumatic Eyes: PERRLA, EOMi, conjunctiva Ears: normal TM's and external ear canals both ears Nose: Nares normal. Septum midline. Mucosa normal. No drainage or sinus tenderness. Throat: lips, mucosa, and tongue normal; teeth and gums normal Neck: no adenopathy, no carotid bruit, supple, symmetrical, trachea midline and thyroid not enlarged, symmetric, no tenderness/mass/nodules Back: symmetric, no curvature. ROM normal. No CVA tenderness. Lungs: clear to auscultation bilaterally Breasts: normal appearance, no masses or tenderness Heart: regular rate and rhythm, S1, S2 normal, no murmur, click, rub or gallop Abdomen: soft, non-tender; bowel sounds normal; no masses,  no organomegaly Pelvic: cervix normal in appearance,  external genitalia normal, no adnexal masses or tenderness, no cervical motion tenderness, rectovaginal septum normal, uterus normal size, shape, and consistency and vagina normal without discharge Extremities: extremities normal, atraumatic, no cyanosis or edema Pulses: 2+ and symmetric Skin: Skin color, texture, turgor normal. 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  Lymph nodes: Cervical, supraclavicular, and axillary nodes normal. Neurologic: Grossly normal    Assessment:    Healthy female exam.  Plan:     See After Visit Summary for Counseling Recommendations  Due for screening labs.   Encourage health diet, smoking cessation and regular exercise. She says she plan on starting an exercise program.   Callouses - Will try lac-hydrin. If not helping then please le me know

## 2010-08-30 LAB — COMPLETE METABOLIC PANEL WITH GFR
ALT: 17 U/L (ref 0–35)
AST: 19 U/L (ref 0–37)
Albumin: 4.2 g/dL (ref 3.5–5.2)
Alkaline Phosphatase: 71 U/L (ref 39–117)
BUN: 12 mg/dL (ref 6–23)
CO2: 22 mEq/L (ref 19–32)
Calcium: 9.5 mg/dL (ref 8.4–10.5)
Chloride: 108 mEq/L (ref 96–112)
Creat: 0.82 mg/dL (ref 0.40–1.20)
GFR, Est African American: >60 mL/min (ref >60)
GFR, Est Non African American: >60 mL/min (ref >60)
Glucose, Bld: 75 mg/dL (ref 70–99)
Potassium: 4.3 mEq/L (ref 3.5–5.3)
Sodium: 140 mEq/L (ref 135–145)
Total Bilirubin: 0.4 mg/dL (ref 0.3–1.2)
Total Protein: 7 g/dL (ref 6.0–8.3)

## 2010-08-30 LAB — LIPID PANEL
Cholesterol: 202 mg/dL — ABNORMAL HIGH (ref 0–200)
HDL: 53 mg/dL (ref >39)
LDL Cholesterol: 130 mg/dL — ABNORMAL HIGH (ref 0–99)
Total CHOL/HDL Ratio: 3.8 Ratio
Triglycerides: 97 mg/dL (ref <150)
VLDL: 19 mg/dL (ref 0–40)

## 2010-09-01 ENCOUNTER — Telehealth: Payer: Self-pay | Admitting: Family Medicine

## 2010-09-01 MED ORDER — METRONIDAZOLE 500 MG PO TABS: 500.0000 mg | ORAL_TABLET | Freq: Two times a day (BID) | ORAL | Status: AC

## 2010-09-01 NOTE — Telephone Encounter (Signed)
Pt.notified

## 2010-09-01 NOTE — Telephone Encounter (Signed)
Call patient: Her cholesterol is up a little bit from last time. Make sure she is eating healthy and working on regular exercise and we'll recheck this in one year. Also her Pap smear is normal repeat in one year. She was positive for an STD culture Trichomonas. I will send her a prescription to her pharmacy to complete to get rid of the infection.

## 2010-09-07 ENCOUNTER — Telehealth: Payer: Self-pay | Admitting: Family Medicine

## 2010-09-07 NOTE — Telephone Encounter (Signed)
Pt called and wants to do STD/ HIV testing labs on Friday.  Told pt to call front desk first thing Friday morning and in interim I'll send a  Message to the provider medical asst to do order once front staff arrives her.  Pt voiced understanding. Jarvis Newcomer, LPN Domingo Dimes  Routed to Avon Gully, CMA

## 2010-09-08 ENCOUNTER — Telehealth: Payer: Self-pay | Admitting: *Deleted

## 2010-09-08 ENCOUNTER — Telehealth: Payer: Self-pay | Admitting: Family Medicine

## 2010-09-08 DIAGNOSIS — Z113 Encounter for screening for infections with a predominantly sexual mode of transmission: Secondary | ICD-10-CM

## 2010-09-08 NOTE — Telephone Encounter (Signed)
Pt tried to call in as instructed yesterday and have front staff arive her for lab appt to [facility] today, but she was sent to the triage nurse voice mail.  Talked with Avon Gully, CMA and she'll go ahead and put the order in for the patient and pt notified just to go to lab [facility]) downstairs. Jarvis Newcomer, LPN Domingo Dimes

## 2010-09-09 LAB — HIV ANTIBODY (ROUTINE TESTING W REFLEX): HIV: NONREACTIVE

## 2010-09-09 LAB — RPR

## 2010-09-11 NOTE — Telephone Encounter (Signed)
m, °

## 2010-09-20 ENCOUNTER — Telehealth: Payer: Self-pay | Admitting: Family Medicine

## 2010-09-20 NOTE — Telephone Encounter (Signed)
Message copied by Agapito Games on Wed Sep 20, 2010  8:34 PM ------      Message from: Payton Spark P      Created: Wed Sep 20, 2010  4:30 PM                   ----- Message -----         From: Lab In [place] Interface         Sent: 09/09/2010   2:47 AM           To: Avon Gully, CMA

## 2010-09-20 NOTE — Telephone Encounter (Signed)
Call pt: HIV and syphillis is negative.

## 2010-09-21 NOTE — Telephone Encounter (Signed)
Advised pt of results.

## 2011-08-08 LAB — LIPID PANEL
Cholesterol: 196 mg/dL (ref 0–200)
HDL: 77 mg/dL — AB (ref 35–70)
LDL Cholesterol: 111 mg/dL
Triglycerides: 40 mg/dL (ref 40–160)

## 2011-08-08 LAB — HEPATIC FUNCTION PANEL
ALT: 31 U/L (ref 7–35)
AST: 21 U/L (ref 13–35)
Bilirubin, Total: 0.3 mg/dL

## 2011-08-08 LAB — CBC AND DIFFERENTIAL
HCT: 49 % — AB (ref 36–46)
Hemoglobin: 16.3 g/dL — AB (ref 12.0–16.0)
Platelets: 215 10*3/uL (ref 150–399)
WBC: 6.7 10^3/mL

## 2011-08-08 LAB — BASIC METABOLIC PANEL
BUN: 24 mg/dL — AB (ref 4–21)
Creatinine: 0.8 mg/dL (ref 0.5–1.1)
Glucose: 88 mg/dL
Potassium: 4.8 mmol/L (ref 3.4–5.3)
Sodium: 140 mmol/L (ref 137–147)

## 2011-08-08 LAB — TSH: TSH: 0.88 u[IU]/mL (ref 0.41–5.90)

## 2011-08-27 ENCOUNTER — Other Ambulatory Visit: Payer: Self-pay | Admitting: Family Medicine

## 2011-08-27 DIAGNOSIS — Z1231 Encounter for screening mammogram for malignant neoplasm of breast: Secondary | ICD-10-CM

## 2011-09-20 ENCOUNTER — Ambulatory Visit
Admission: RE | Admit: 2011-09-20 | Discharge: 2011-09-20 | Disposition: A | Discharge status: Home / Self Care | Payer: BC Managed Care – PPO | Source: Ambulatory Visit | Attending: Family Medicine | Admitting: Family Medicine

## 2011-09-20 DIAGNOSIS — Z1231 Encounter for screening mammogram for malignant neoplasm of breast: Secondary | ICD-10-CM

## 2011-09-24 ENCOUNTER — Ambulatory Visit (INDEPENDENT_AMBULATORY_CARE_PROVIDER_SITE_OTHER): Payer: BC Managed Care – PPO | Admitting: Family Medicine

## 2011-09-24 ENCOUNTER — Encounter: Payer: Self-pay | Admitting: Family Medicine

## 2011-09-24 VITALS — BP 103/67 | HR 11 | Ht 66.0 in | Wt 177.0 lb

## 2011-09-24 DIAGNOSIS — N912 Amenorrhea, unspecified: Secondary | ICD-10-CM

## 2011-09-24 DIAGNOSIS — E785 Hyperlipidemia, unspecified: Secondary | ICD-10-CM | POA: Insufficient documentation

## 2011-09-24 DIAGNOSIS — Z01419 Encounter for gynecological examination (general) (routine) without abnormal findings: Secondary | ICD-10-CM

## 2011-09-24 DIAGNOSIS — Z23 Encounter for immunization: Secondary | ICD-10-CM

## 2011-09-24 DIAGNOSIS — Z720 Tobacco use: Secondary | ICD-10-CM

## 2011-09-24 DIAGNOSIS — R5383 Other fatigue: Secondary | ICD-10-CM

## 2011-09-24 LAB — CBC WITH DIFFERENTIAL/PLATELET
Basophils Absolute: 0 10*3/uL (ref 0.0–0.1)
Basophils Relative: 1 % (ref 0–1)
Eosinophils Absolute: 0.1 10*3/uL (ref 0.0–0.7)
Eosinophils Relative: 1 % (ref 0–5)
HCT: 45.6 % (ref 36.0–46.0)
Hemoglobin: 15.4 g/dL — ABNORMAL HIGH (ref 12.0–15.0)
Lymphocytes Relative: 33 % (ref 12–46)
Lymphs Abs: 2.4 10*3/uL (ref 0.7–4.0)
MCH: 29.1 pg (ref 26.0–34.0)
MCHC: 33.8 g/dL (ref 30.0–36.0)
MCV: 86.2 fL (ref 78.0–100.0)
Monocytes Absolute: 0.6 10*3/uL (ref 0.1–1.0)
Monocytes Relative: 8 % (ref 3–12)
Neutro Abs: 4 10*3/uL (ref 1.7–7.7)
Neutrophils Relative %: 57 % (ref 43–77)
Platelets: 252 10*3/uL (ref 150–400)
RBC: 5.29 MIL/uL — ABNORMAL HIGH (ref 3.87–5.11)
RDW: 13.9 % (ref 11.5–15.5)
WBC: 7.1 10*3/uL (ref 4.0–10.5)

## 2011-09-24 LAB — VITAMIN B12: Vitamin B-12: 568 pg/mL (ref 211–911)

## 2011-09-24 LAB — TSH: TSH: 1.443 u[IU]/mL (ref 0.350–4.500)

## 2011-09-24 NOTE — Progress Notes (Signed)
Subjective:     Kristen Fletcher is a 46 y.o. female and is here for a comprehensive physical exam. The patient reports no problems. She says she plans on quitting smoking. She's been exercising more. She says that the next time I see her she plans to be smoke-free. She is not interested in getting a pneumonia vaccine but is okay with getting her tetanus.  History   Social History  . Marital Status: Divorced    Spouse Name: N/A    Number of Children: N/A  . Years of Education: N/A   Occupational History  . works form home    Social History Main Topics  . Smoking status: Current Everyday Smoker -- 0.5 packs/day    Types: Cigarettes  . Smokeless tobacco: Not on file  . Alcohol Use: Yes  . Drug Use: No  . Sexually Active: Not on file   Other Topics Concern  . Not on file   Social History Narrative  . No narrative on file   Health Maintenance  Topic Date Due  . Influenza Vaccine  01/08/2012  . Pap Smear  08/24/2013  . Tetanus/tdap  09/23/2021    The following portions of the patient's history were reviewed and updated as appropriate: allergies, current medications, past family history, past medical history, past social history, past surgical history and problem list.  Review of Systems A comprehensive review of systems was negative.   Objective:    BP 103/67  Pulse 11  Ht 5\' 6"  (1.676 m)  Wt 177 lb (80.287 kg)  BMI 28.57 kg/m2  SpO2 98% General appearance: alert, cooperative and appears stated age Head: Normocephalic, without obvious abnormality, atraumatic Eyes: conj clear, EOMi, PEERLA Ears: normal TM's and external ear canals both ears Nose: Nares normal. Septum midline. Mucosa normal. No drainage or sinus tenderness. Throat: lips, mucosa, and tongue normal; teeth and gums normal Neck: no adenopathy, no carotid bruit, no JVD, supple, symmetrical, trachea midline and thyroid not enlarged, symmetric, no tenderness/mass/nodules Back: symmetric, no curvature. ROM normal.  No CVA tenderness. Lungs: clear to auscultation bilaterally Breasts: normal appearance, no masses or tenderness Heart: regular rate and rhythm, S1, S2 normal, no murmur, click, rub or gallop Abdomen: soft, non-tender; bowel sounds normal; no masses,  no organomegaly Pelvic: cervix normal in appearance, external genitalia normal, no adnexal masses or tenderness, no cervical motion tenderness, rectovaginal septum normal, uterus normal size, shape, and consistency and vagina normal without discharge Extremities: she has some mild clubbing of fingerstips with dry cracking skin. Pulses: 2+ and symmetric Skin: Skin color, texture, turgor normal. No rashes or lesions Lymph nodes: Cervical, supraclavicular, and axillary nodes normal. Neurologic: Grossly normal    Assessment:    Healthy female exam.      Plan:     See After Visit Summary for Counseling Recommendations  Start a regular exercise program and make sure you are eating a healthy diet Try to eat 4 servings of dairy a day or take a calcium supplement (500mg  twice a day). Your vaccines are up to date.  She had CMP and lipoids done at work. She will have them faxed to our office Given Tdap today.   She had her mammogram last week.  She does request STD testing.   Tobacco abuse-she is working on smoking cessation. Declined pneumonia vaccine today.  She would also like to have her hormones checked. She's not had her period of months and wants to see if she is now fully menopausal or not.  She  also complains of being very fatigued recently. Fortunately her new job has actually been less stressful for her. We'll check a thyroid level, rule out anemia. We'll also check a vitamin D and B12 level.  Scleroderma-she has a followup with dermatology soon. She is trying some Nitro-Bid topical ointment that was given her by nurse practitioner at work.

## 2011-09-24 NOTE — Patient Instructions (Addendum)
Start a regular exercise program and make sure you are eating a healthy diet Try to eat 4 servings of dairy a day  Fax [place] a copy of your cholesterol.

## 2011-09-24 NOTE — Addendum Note (Signed)
Addended by: Avon Gully C on: 09/24/2011 12:04 PM   Modules accepted: Orders

## 2011-09-25 ENCOUNTER — Other Ambulatory Visit (HOSPITAL_COMMUNITY)
Admission: RE | Admit: 2011-09-25 | Discharge: 2011-09-25 | Disposition: A | Discharge status: Home / Self Care | Payer: BC Managed Care – PPO | Source: Ambulatory Visit | Attending: Family Medicine | Admitting: Family Medicine

## 2011-09-25 DIAGNOSIS — Z01419 Encounter for gynecological examination (general) (routine) without abnormal findings: Secondary | ICD-10-CM | POA: Insufficient documentation

## 2011-09-25 LAB — PROGESTERONE: Progesterone: <0.2 ng/mL

## 2011-09-25 LAB — VITAMIN D 25 HYDROXY (VIT D DEFICIENCY, FRACTURES): Vit D, 25-Hydroxy: 22 ng/mL — ABNORMAL LOW (ref 30–89)

## 2011-09-25 LAB — HIV ANTIBODY (ROUTINE TESTING W REFLEX): HIV: NONREACTIVE

## 2011-09-25 LAB — RPR

## 2011-09-25 LAB — LUTEINIZING HORMONE: LH: 41.2 m[IU]/mL

## 2011-09-25 LAB — ESTRADIOL: Estradiol: 15.6 pg/mL

## 2011-09-25 LAB — FOLLICLE STIMULATING HORMONE: FSH: 75.6 m[IU]/mL

## 2011-09-25 NOTE — Addendum Note (Signed)
Addended by: Wyline Beady on: 09/25/2011 08:53 AM   Modules accepted: Orders

## 2011-10-10 ENCOUNTER — Telehealth: Payer: Self-pay | Admitting: Family Medicine

## 2011-10-10 NOTE — Telephone Encounter (Signed)
Call patient: Her total cholesterol and triglycerides look great. Her LDL is up slightly at 111. Continue work on low fat diet and regular exercise. I like to see this under 100 next year. Her complete metabolic panel looks great. Thyroid is normal. Her hemoglobin is high at 16.3, this is likely from her smoking. Work on quitting smoking and recheck in 6 months.

## 2011-10-10 NOTE — Telephone Encounter (Signed)
Left message on vm

## 2011-10-15 ENCOUNTER — Encounter: Payer: Self-pay | Admitting: *Deleted

## 2011-11-01 ENCOUNTER — Telehealth: Payer: Self-pay | Admitting: Family Medicine

## 2011-11-01 ENCOUNTER — Encounter: Payer: Self-pay | Admitting: *Deleted

## 2011-11-01 NOTE — Telephone Encounter (Signed)
Call pt: Labs all look great you faxed over.  Will enter into char.

## 2011-11-02 NOTE — Telephone Encounter (Signed)
Left message on vm

## 2012-02-26 ENCOUNTER — Ambulatory Visit (INDEPENDENT_AMBULATORY_CARE_PROVIDER_SITE_OTHER): Payer: BC Managed Care – PPO | Admitting: Sports Medicine

## 2012-02-26 ENCOUNTER — Encounter: Payer: Self-pay | Admitting: Sports Medicine

## 2012-02-26 VITALS — BP 106/63 | HR 70 | Temp 98.2°F | Wt 178.0 lb

## 2012-02-26 DIAGNOSIS — J040 Acute laryngitis: Secondary | ICD-10-CM

## 2012-02-26 DIAGNOSIS — B9789 Other viral agents as the cause of diseases classified elsewhere: Secondary | ICD-10-CM | POA: Insufficient documentation

## 2012-02-26 MED ORDER — PREDNISONE 50 MG PO TABS: ORAL_TABLET | ORAL | Status: DC

## 2012-02-26 NOTE — Progress Notes (Signed)
Subjective:     Patient ID: Kristen Fletcher, female   DOB: [birth year], 46 y.o.   MRN: [id number]  HPI Patient is a 46 yo woman with a history of scleroderma who presents today with 4-5 days of hoarse voice and a dry cough.   Patients first symptom of illness was the loss of her voice about 4-5 days ago. Since then she has felt tired, had a sore throat and a cough. Patient says the cough is productive of clear sputum.   She had a recent trip to a hospital for a doctors visit where she believes she was around "a lot of sick people" and the symptoms started the next day. She has taken nyquil which has provided no relief.   She smokes about half a pack a cigarettes a day.  Review of Systems  Constitutional: Negative for fever and chills.  HENT: Positive for congestion, sore throat and rhinorrhea. Negative for facial swelling, trouble swallowing and sinus pressure.   Respiratory: Positive for cough. Negative for shortness of breath, wheezing and stridor.   Gastrointestinal: Positive for diarrhea. Negative for nausea and vomiting.  All other systems reviewed and are negative.   Past medical, surgical, social, family, and allergies, reviewed and no changes need from what is already in the chart.    Objective:   Physical Exam  Constitutional: She appears well-developed and well-nourished.  HENT:  Head: Normocephalic and atraumatic.  Right Ear: External ear normal.  Left Ear: External ear normal.  Mouth/Throat: No oropharyngeal exudate.       Erythematous oropharynx, boggy turbinates.   Eyes: Conjunctivae normal and EOM are normal. Pupils are equal, round, and reactive to light. Right eye exhibits no discharge. Left eye exhibits no discharge. No scleral icterus.  Neck: Normal range of motion. Neck supple. No thyromegaly present.  Cardiovascular: Normal rate, regular rhythm, normal heart sounds and intact distal pulses.  Exam reveals no gallop and no friction rub.   No murmur  heard. Pulmonary/Chest: Effort normal and breath sounds normal. No respiratory distress. She has no wheezes. She has no rales. She exhibits no tenderness.  Lymphadenopathy:    She has no cervical adenopathy.  Skin: Skin is warm and dry.      Assessment/plan:

## 2012-02-26 NOTE — Assessment & Plan Note (Signed)
5 day burst of prednisone. Focal rest. Hydration. Smoking cessation. She will followup with her primary care physician in one to 2 weeks as needed.

## 2012-02-26 NOTE — Patient Instructions (Addendum)
Laryngitis At the top of your windpipe is your voice box. It is the source of your voice. Inside your voice box are 2 bands of muscles called vocal cords. When you breathe, your vocal cords are relaxed and open so that air can get into the lungs. When you decide to say something, these cords come together and vibrate. The sound from these vibrations goes into your throat and comes out through your mouth as sound. Laryngitis is an inflammation of the vocal cords that causes hoarseness, cough, loss of voice, sore throat, and dry throat. Laryngitis can be temporary (acute) or long-term (chronic). Most cases of acute laryngitis improve with time.Chronic laryngitis lasts for more than 3 weeks. CAUSES Laryngitis can often be related to excessive smoking, talking, or yelling, as well as inhalation of toxic fumes and allergies. Acute laryngitis is usually caused by a viral infection, vocal strain, measles or mumps, or bacterial infections. Chronic laryngitis is usually caused by vocal cord strain, vocal cord injury, postnasal drip, growths on the vocal cords, or acid reflux. SYMPTOMS   Cough.  Sore throat.  Dry throat. RISK FACTORS  Respiratory infections.  Exposure to irritating substances, such as cigarette smoke, excessive amounts of alcohol, stomach acids, and workplace chemicals.  Voice trauma, such as vocal cord injury from shouting or speaking too loud. DIAGNOSIS  Your cargiver will perform a physical exam. During the physical exam, your caregiver will examine your throat. The most common sign of laryngitis is hoarseness. Laryngoscopy may be necessary to confirm the diagnosis of this condition. This procedure allows your caregiver to look into the larynx. HOME CARE INSTRUCTIONS  Drink enough fluids to keep your urine clear or pale yellow.  Rest until you no longer have symptoms or as directed by your caregiver.  Breathe in moist air.  Take all medicine as directed by your  caregiver.  Do not smoke.  Talk as little as possible (this includes whispering).  Write on paper instead of talking until your voice is back to normal.  Follow up with your caregiver if your condition has not improved after 10 days. SEEK MEDICAL CARE IF:   You have trouble breathing.  You cough up blood.  You have persistent fever.  You have increasing pain.  You have difficulty swallowing. MAKE SURE YOU:  Understand these instructions.  Will watch your condition.  Will get help right away if you are not doing well or get worse. Document Released: 03/26/2005 Document Revised: 06/18/2011 Document Reviewed: 06/01/2010 [employer] Patient Information 2013 [employer].  

## 2012-07-30 ENCOUNTER — Ambulatory Visit (INDEPENDENT_AMBULATORY_CARE_PROVIDER_SITE_OTHER): Payer: BC Managed Care – PPO

## 2012-07-30 ENCOUNTER — Ambulatory Visit (INDEPENDENT_AMBULATORY_CARE_PROVIDER_SITE_OTHER): Payer: BC Managed Care – PPO | Admitting: Sports Medicine

## 2012-07-30 VITALS — BP 120/70 | HR 74

## 2012-07-30 DIAGNOSIS — M349 Systemic sclerosis, unspecified: Secondary | ICD-10-CM

## 2012-07-30 DIAGNOSIS — M79609 Pain in unspecified limb: Secondary | ICD-10-CM

## 2012-07-30 MED ORDER — AMLODIPINE BESYLATE 10 MG PO TABS: 10.0000 mg | ORAL_TABLET | Freq: Every day | ORAL | Status: DC

## 2012-07-30 MED ORDER — TRAMADOL HCL 50 MG PO TABS: 50.0000 mg | ORAL_TABLET | Freq: Three times a day (TID) | ORAL | Status: DC | PRN

## 2012-07-30 MED ORDER — PREDNISONE 50 MG PO TABS: ORAL_TABLET | ORAL | Status: DC

## 2012-07-30 MED ORDER — MELOXICAM 15 MG PO TABS: ORAL_TABLET | ORAL | Status: DC

## 2012-07-30 NOTE — Assessment & Plan Note (Signed)
Crystals having a flare of her Raynaud's disease. I'm going to treat fairly aggressively with prednisone, tramadol, Mobic, amlodipine, and they do need x-rays. She does endorse that she does not want to see a rheumatologist. I will see her back next week to see how things are going.

## 2012-07-30 NOTE — Progress Notes (Signed)
   Subjective:    CC: Finger pain  HPI: Kristen Fletcher is a very pleasant 47 year old female with history of scleroderma, with Raynaud phenomenon, she comes in with a several-day history of pain that she localizes along her right index finger at the distal phalanx. The pain is sharp and worse with palpation. She denies any injuries. She saw a work Engineer, civil (consulting) and was advised to see a physician. Overall the pain is better than when the whole process started. She did have a rheumatologist, but tells me she does not want to see another one. Pain is localized without radiation, severe.  Past medical history, Surgical history, Family history not pertinant except as noted below, Social history, Allergies, and medications have been entered into the medical record, reviewed, and no changes needed.   Review of Systems: No headache, visual changes, nausea, vomiting, diarrhea, constipation, dizziness, abdominal pain, skin rash, fevers, chills, night sweats, weight loss, swollen lymph nodes, body aches, joint swelling, muscle aches, chest pain, shortness of breath, mood changes, visual or auditory hallucinations.   Objective:   General: Well Developed, well nourished, and in no acute distress.  Neuro/Psych: Alert and oriented x3, extra-ocular muscles intact, able to move all 4 extremities, sensation grossly intact. Skin: Warm and dry, no rashes noted.  Respiratory: Not using accessory muscles, speaking in full sentences, trachea midline.  Cardiovascular: Pulses palpable, no extremity edema. Abdomen: Does not appear distended. 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 She has exquisite tenderness to palpation over the distal phalanx of the right index finger. There is no pain over the joint, it is predominantly over the nail. There is no sign of infection, and she is neurovascularly intact distally. There is some pallor of the actual phalanx at the nailbed, but color does return with palpation and pressure.  Passive and active range of motion remains good.  Impression and Recommendations:   This case required medical decision making of moderate complexity.

## 2012-08-06 ENCOUNTER — Encounter: Payer: Self-pay | Admitting: Sports Medicine

## 2012-08-06 ENCOUNTER — Ambulatory Visit (INDEPENDENT_AMBULATORY_CARE_PROVIDER_SITE_OTHER): Payer: BC Managed Care – PPO | Admitting: Sports Medicine

## 2012-08-06 VITALS — BP 97/61 | HR 72 | Temp 98.0°F | Wt 186.0 lb

## 2012-08-06 DIAGNOSIS — M349 Systemic sclerosis, unspecified: Secondary | ICD-10-CM

## 2012-08-06 MED ORDER — PREDNISONE 10 MG PO TABS: ORAL_TABLET | ORAL | Status: DC

## 2012-08-06 NOTE — Assessment & Plan Note (Signed)
Drastic improvement in finger pain as well as Raynaud symptoms with addition of amlodipine 10 mg, and prednisone. She is more amenable today to seeing a rheumatologist. I'm going to set her up with Dr. Corliss Skains. I'm going to add a 12 day taper of prednisone as well. Her blood pressure is slightly low on the amlodipine but she is asymptomatic.

## 2012-08-06 NOTE — Progress Notes (Signed)
   Subjective:    CC: Followup  HPI: Scleroderma: I saw Hena recently, she was having a flare of dactylitis. At that time, she was very resistant to seeing a rheumatologist. I treated her with prednisone, and increased her amlodipine. She returns today with symptoms improved significantly, and no adverse effects from the amlodipine even though her blood pressure is a little bit on the soft side. She is very happy with the results, and is now more amenable to seeing a rheumatologist.  Past medical history, Surgical history, Family history not pertinant except as noted below, Social history, Allergies, and medications have been entered into the medical record, reviewed, and no changes needed.   Review of Systems: No headache, visual changes, nausea, vomiting, diarrhea, constipation, dizziness, abdominal pain, skin rash, fevers, chills, night sweats, weight loss, swollen lymph nodes, body aches, joint swelling, muscle aches, chest pain, shortness of breath, mood changes, visual or auditory hallucinations.   Objective:   General: Well Developed, well nourished, and in no acute distress.  Neuro/Psych: Alert and oriented x3, extra-ocular muscles intact, able to move all 4 extremities, sensation grossly intact. Skin: Warm and dry, no rashes noted.  Respiratory: Not using accessory muscles, speaking in full sentences, trachea midline.  Cardiovascular: Pulses palpable, no extremity edema. Abdomen: Does not appear distended. 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 She no longer has exquisite tenderness to palpation over the distal phalanx of the right index finger. There is no pain over the joint, it is predominantly over the nail. There is no sign of infection, and she is neurovascularly intact distally. There is some pallor of the actual phalanx at the nailbed, but color does return with palpation and pressure. Passive and active range of motion remains good.  Impression and  Recommendations:   This case required medical decision making of moderate complexity.

## 2012-08-14 ENCOUNTER — Telehealth: Payer: Self-pay | Admitting: *Deleted

## 2012-08-14 MED ORDER — PREDNISONE 5 MG PO TBEC: 5.0000 mg | DELAYED_RELEASE_TABLET | Freq: Every day | ORAL | Status: DC

## 2012-08-14 NOTE — Telephone Encounter (Signed)
Patient calls and states she is waking up in pain and you gave her a 5 day dose of prednisone. She wants to know if you can give her more either 10 days worth or 30 days worth.

## 2012-08-14 NOTE — Telephone Encounter (Signed)
I will very low dose 5 mg per day, every day. She still needs to followup with her rheumatologist.

## 2012-08-14 NOTE — Telephone Encounter (Signed)
Pt notiied. Barry Dienes, LPN

## 2012-09-03 ENCOUNTER — Other Ambulatory Visit: Payer: Self-pay | Admitting: Family Medicine

## 2012-09-03 DIAGNOSIS — Z1231 Encounter for screening mammogram for malignant neoplasm of breast: Secondary | ICD-10-CM

## 2012-09-16 ENCOUNTER — Ambulatory Visit
Admission: RE | Admit: 2012-09-16 | Discharge: 2012-09-16 | Disposition: A | Discharge status: Home / Self Care | Payer: BC Managed Care – PPO | Source: Ambulatory Visit | Attending: Family Medicine | Admitting: Family Medicine

## 2012-09-16 DIAGNOSIS — Z1231 Encounter for screening mammogram for malignant neoplasm of breast: Secondary | ICD-10-CM

## 2012-10-08 ENCOUNTER — Encounter: Payer: Self-pay | Admitting: Cardiovascular Disease

## 2012-10-08 ENCOUNTER — Ambulatory Visit (INDEPENDENT_AMBULATORY_CARE_PROVIDER_SITE_OTHER): Payer: BC Managed Care – PPO | Admitting: Cardiovascular Disease

## 2012-10-08 VITALS — BP 105/70 | HR 63 | Ht 67.0 in | Wt 182.0 lb

## 2012-10-08 DIAGNOSIS — M349 Systemic sclerosis, unspecified: Secondary | ICD-10-CM

## 2012-10-08 NOTE — Patient Instructions (Addendum)

## 2012-10-08 NOTE — Progress Notes (Signed)
History of Present Illness: 47 yo female with history of scleroderma, tobacco abuse who is here today to establish cardiology care. She has had no prior cardiac workup. She has been followed in primary care for her scleroderma. She has had recent worseing of Raynauds with  dactylitis which has been responsive to Prednisone and Norvasc. She was seen in Rheumatology Clinic yesterday by Dr. Corliss Skains. She was referred here for baseline cardiac examination. She denies any chest pain, SOB, palpitations, dizziness, near syncope or syncope. Her only complaint is pain over the tip of the right index finger.   Primary Care Physician: Nani Gasser  Last Lipid Profile:Lipid Panel     Component Value Date/Time   CHOL 196 08/08/2011   TRIG 40 08/08/2011   HDL 77* 08/08/2011   CHOLHDL 3.8 08/25/2010 1117   VLDL 19 08/25/2010 1117   LDLCALC 111 08/08/2011     Past Medical History  Diagnosis Date  . Scleroderma     Dr Norina Buzzard    Past Surgical History  Procedure Laterality Date  . Cervical disc surgery  2005    Current Outpatient Prescriptions  Medication Sig Dispense Refill  . amLODipine (NORVASC) 10 MG tablet Take 1 tablet (10 mg total) by mouth daily.  30 tablet  0  . hydrocortisone 2.5 % cream       . Multiple Vitamin (MULTIVITAMIN) tablet Take 1 tablet by mouth daily.      . PredniSONE 5 MG TBEC Take 5 mg by mouth daily.  30 tablet  1  . meloxicam (MOBIC) 15 MG tablet One tab PO qAM with breakfast for 2 weeks, then daily prn pain.  30 tablet  3   No current facility-administered medications for this visit.    No Known Allergies  History   Social History  . Marital Status: Divorced    Spouse Name: N/A    Number of Children: N/A  . Years of Education: N/A   Occupational History  . works form home    Social History Main Topics  . Smoking status: Current Every Day Smoker -- 0.50 packs/day    Types: Cigarettes  . Smokeless tobacco: Not on file  . Alcohol Use: Yes  . Drug Use:  No  . Sexually Active: Not on file   Other Topics Concern  . Not on file   Social History Narrative  . No narrative on file    Family History  Problem Relation Age of Onset  . Breast cancer Mother   . Heart attack Father     early 23's  . Diabetes Maternal Grandmother     Review of Systems:  As stated in the HPI and otherwise negative.   BP 105/70  Pulse 63  Ht 5\' 7"  (1.702 m)  Wt 182 lb (82.555 kg)  BMI 28.5 kg/m2  Physical Examination: General: Well developed, well nourished, NAD HEENT: OP clear, mucus membranes moist SKIN: warm, dry. No rashes. Neuro: No focal deficits Musculoskeletal: Muscle strength 5/5 all ext Psychiatric: Mood and affect normal Neck: No JVD, no carotid bruits, no thyromegaly, no lymphadenopathy. Lungs:Clear bilaterally, no wheezes, rhonci, crackles Cardiovascular: Regular rate and rhythm. No murmurs, gallops or rubs. Abdomen:Soft. Bowel sounds present. Non-tender.  Extremities: No lower extremity edema. Pulses are 2 + in the bilateral DP/PT. Right index finger with dry, necrotic area over distal tip on dorsum. Skin changes over both hands.   EKG: NSR, rate 63 bpm. Normal EKG.   Assessment and Plan:   1. Scleroderma: Managed in  rheumatology. She is referred today for baseline cardiac examination. Her EKG is normal. Cardiac examination is normal. Will arrange echocardiogram to assess LV function, exclude structural heart disease. I will see her back in one year.

## 2012-10-30 ENCOUNTER — Other Ambulatory Visit (HOSPITAL_COMMUNITY): Payer: BC Managed Care – PPO

## 2012-10-30 ENCOUNTER — Institutional Professional Consult (permissible substitution): Payer: BC Managed Care – PPO | Admitting: Internal Medicine

## 2013-09-17 ENCOUNTER — Ambulatory Visit
Admission: RE | Admit: 2013-09-17 | Discharge: 2013-09-17 | Disposition: A | Discharge status: Home / Self Care | Payer: BC Managed Care – PPO | Source: Ambulatory Visit

## 2013-09-17 ENCOUNTER — Encounter (INDEPENDENT_AMBULATORY_CARE_PROVIDER_SITE_OTHER): Payer: Self-pay

## 2013-09-17 ENCOUNTER — Other Ambulatory Visit: Payer: Self-pay

## 2013-09-17 DIAGNOSIS — Z1231 Encounter for screening mammogram for malignant neoplasm of breast: Secondary | ICD-10-CM

## 2013-09-18 ENCOUNTER — Other Ambulatory Visit (HOSPITAL_COMMUNITY)
Admission: RE | Admit: 2013-09-18 | Discharge: 2013-09-18 | Disposition: A | Discharge status: Home / Self Care | Payer: BC Managed Care – PPO | Source: Ambulatory Visit | Attending: Family Medicine | Admitting: Family Medicine

## 2013-09-18 ENCOUNTER — Ambulatory Visit (INDEPENDENT_AMBULATORY_CARE_PROVIDER_SITE_OTHER): Payer: BC Managed Care – PPO | Admitting: Family Medicine

## 2013-09-18 ENCOUNTER — Encounter: Payer: Self-pay | Admitting: Family Medicine

## 2013-09-18 VITALS — BP 97/52 | HR 71 | Ht 67.0 in | Wt 185.0 lb

## 2013-09-18 DIAGNOSIS — Z1151 Encounter for screening for human papillomavirus (HPV): Secondary | ICD-10-CM | POA: Insufficient documentation

## 2013-09-18 DIAGNOSIS — Z23 Encounter for immunization: Secondary | ICD-10-CM

## 2013-09-18 DIAGNOSIS — Z720 Tobacco use: Secondary | ICD-10-CM

## 2013-09-18 DIAGNOSIS — Z113 Encounter for screening for infections with a predominantly sexual mode of transmission: Secondary | ICD-10-CM

## 2013-09-18 DIAGNOSIS — F17200 Nicotine dependence, unspecified, uncomplicated: Secondary | ICD-10-CM

## 2013-09-18 DIAGNOSIS — E559 Vitamin D deficiency, unspecified: Secondary | ICD-10-CM

## 2013-09-18 DIAGNOSIS — Z0000 Encounter for general adult medical examination without abnormal findings: Secondary | ICD-10-CM

## 2013-09-18 DIAGNOSIS — Z01419 Encounter for gynecological examination (general) (routine) without abnormal findings: Secondary | ICD-10-CM | POA: Insufficient documentation

## 2013-09-18 DIAGNOSIS — M7989 Other specified soft tissue disorders: Secondary | ICD-10-CM

## 2013-09-18 MED ORDER — NICOTINE 21 MG/24HR TD PT24: 21.0000 mg | MEDICATED_PATCH | Freq: Every day | TRANSDERMAL | Status: DC

## 2013-09-18 NOTE — Patient Instructions (Addendum)
Keep up a regular exercise program and make sure you are eating a healthy diet Try to eat 4 servings of dairy a day, or if you are lactose intolerant take a calcium with vitamin D daily.  Your vaccines are up to date.    Smoking Cessation, Tips for Success If you are ready to quit smoking, congratulations! You have chosen to help yourself be healthier. Cigarettes bring nicotine, tar, carbon monoxide, and other irritants into your body. Your lungs, heart, and blood vessels will be able to work better without these poisons. There are many different ways to quit smoking. Nicotine gum, nicotine patches, a nicotine inhaler, or nicotine nasal spray can help with physical craving. Hypnosis, support groups, and medicines help break the habit of smoking. WHAT THINGS CAN I DO TO MAKE QUITTING EASIER?  Here are some tips to help you quit for good:  Pick a date when you will quit smoking completely. Tell all of your friends and family about your plan to quit on that date.  Do not try to slowly cut down on the number of cigarettes you are smoking. Pick a quit date and quit smoking completely starting on that day.  Throw away all cigarettes.   Clean and remove all ashtrays from your home, work, and car.   On a card, write down your reasons for quitting. Carry the card with you and read it when you get the urge to smoke.   Cleanse your body of nicotine. Drink enough water and fluids to keep your urine clear or pale yellow. Do this after quitting to flush the nicotine from your body.   Learn to predict your moods. Do not let a bad situation be your excuse to have a cigarette. Some situations in your life might tempt you into wanting a cigarette.   Never have "just one" cigarette. It leads to wanting another and another. Remind yourself of your decision to quit.   Change habits associated with smoking. If you smoked while driving or when feeling stressed, try other activities to replace smoking. Stand  up when drinking your coffee. Brush your teeth after eating. Sit in a different chair when you read the paper. Avoid alcohol while trying to quit, and try to drink fewer caffeinated beverages. Alcohol and caffeine may urge you to smoke.   Avoid foods and drinks that can trigger a desire to smoke, such as sugary or spicy foods and alcohol.   Ask people who smoke not to smoke around you.   Have something planned to do right after eating or having a cup of coffee. For example, plan to take a walk or exercise.   Try a relaxation exercise to calm you down and decrease your stress. Remember, you may be tense and nervous for the first 2 weeks after you quit, but this will pass.   Find new activities to keep your hands busy. Play with a pen, coin, or rubber band. Doodle or draw things on paper.   Brush your teeth right after eating. This will help cut down on the craving for the taste of tobacco after meals. You can also try mouthwash.   Use oral substitutes in place of cigarettes. Try using lemon drops, carrots, cinnamon sticks, or chewing gum. Keep them handy so they are available when you have the urge to smoke.   When you have the urge to smoke, try deep breathing.   Designate your home as a nonsmoking area.   If you are a heavy smoker,   ask your health care provider about a prescription for nicotine chewing gum. It can ease your withdrawal from nicotine.   Reward yourself. Set aside the cigarette money you save and buy yourself something nice.   Look for support from others. Join a support group or smoking cessation program. Ask someone at home or at work to help you with your plan to quit smoking.   Always ask yourself, "Do I need this cigarette or is this just a reflex?" Tell yourself, "Today, I choose not to smoke," or "I do not want to smoke." You are reminding yourself of your decision to quit.  Do not replace cigarette smoking with electronic cigarettes (commonly called  e-cigarettes). The safety of e-cigarettes is unknown, and some may contain harmful chemicals.  If you relapse, do not give up! Plan ahead and think about what you will do the next time you get the urge to smoke.  HOW WILL I FEEL WHEN I QUIT SMOKING? You may have symptoms of withdrawal because your body is used to nicotine (the addictive substance in cigarettes). You may crave cigarettes, be irritable, feel very hungry, cough often, get headaches, or have difficulty concentrating. The withdrawal symptoms are only temporary. They are strongest when you first quit but will go away within 10 14 days. When withdrawal symptoms occur, stay in control. Think about your reasons for quitting. Remind yourself that these are signs that your body is healing and getting used to being without cigarettes. Remember that withdrawal symptoms are easier to treat than the major diseases that smoking can cause.  Even after the withdrawal is over, expect periodic urges to smoke. However, these cravings are generally short lived and will go away whether you smoke or not. Do not smoke!  WHAT RESOURCES ARE AVAILABLE TO HELP ME QUIT SMOKING? Your health care provider can direct you to community resources or hospitals for support, which may include:  Group support.  Education.  Hypnosis.  Therapy. Document Released: 12/23/2003 Document Revised: 01/14/2013 Document Reviewed: 09/11/2012 [employer] Patient Information 2014 [employer].  

## 2013-09-18 NOTE — Addendum Note (Signed)
Addended by: Deno EtienneBARKLEY, Dejay Kronk L on: 09/18/2013 03:57 PM   Modules accepted: Orders

## 2013-09-18 NOTE — Progress Notes (Signed)
Subjective:     Kristen Fletcher is a 48 y.o. female and is here for a comprehensive physical exam. The patient reports problems - She would like to consider stopping smoking.. Tetanus vaccine is up-to-date. Still smoking half a pack a day.  Ankle swelling right upper sternum and the and the left lower extremities. No known triggers. She says her diet exactly the same and she has not been eating a high salt diet. She has gained a few pounds over the last year. She's not exercising regularly. She says she is ready to try quitting smoking he would like to try the nicotine patch.  History   Social History  . Marital Status: Divorced    Spouse Name: N/A    Number of Children: N/A  . Years of Education: N/A   Occupational History  . works form home    Social History Main Topics  . Smoking status: Current Every Day Smoker -- 0.50 packs/day for 30 years    Types: Cigarettes  . Smokeless tobacco: Not on file  . Alcohol Use: No  . Drug Use: No  . Sexual Activity: Not on file   Other Topics Concern  . Not on file   Social History Narrative  . No narrative on file   Health Maintenance  Topic Date Due  . Mammogram  09/04/2013  . Colonoscopy  09/04/2013  . Influenza Vaccine  11/07/2013  . Pap Smear  09/25/2014  . Tetanus/tdap  09/23/2021    The following portions of the patient's history were reviewed and updated as appropriate: allergies, current medications, past family history, past medical history, past social history, past surgical history and problem list.  Review of Systems A comprehensive review of systems was negative.   Objective:    There were no vitals taken for this visit. General appearance: alert, cooperative and appears stated age Head: Normocephalic, without obvious abnormality, atraumatic Eyes: conj clear, EOMi, PEERLA Ears: normal TM's and external ear canals both ears Nose: Nares normal. Septum midline. Mucosa normal. No drainage or sinus tenderness. Throat:  lips, mucosa, and tongue normal; teeth and gums normal Neck: no adenopathy, no carotid bruit, no JVD, supple, symmetrical, trachea midline and thyroid not enlarged, symmetric, no tenderness/mass/nodules Back: symmetric, no curvature. ROM normal. No CVA tenderness. Lungs: clear to auscultation bilaterally Breasts: normal appearance, no masses or tenderness Heart: regular rate and rhythm, S1, S2 normal, no murmur, click, rub or gallop Abdomen: soft, non-tender; bowel sounds normal; no masses,  no organomegaly Pelvic: cervix normal in appearance, external genitalia normal, no adnexal masses or tenderness, no cervical motion tenderness, rectovaginal septum normal, uterus normal size, shape, and consistency and vagina normal without discharge Extremities: extremities normal, atraumatic, no cyanosis or edema Pulses: 2+ and symmetric Skin: Skin color, texture, turgor normal. No rashes or lesions Lymph nodes: Cervical, supraclavicular, and axillary nodes normal. Neurologic: Alert and oriented X 3, normal strength and tone. Normal symmetric reflexes. Normal coordination and gait    Assessment:    Healthy female exam.      Plan:     See After Visit Summary for Counseling Recommendations  Keep up a regular exercise program and make sure you are eating a healthy diet Try to eat 4 servings of dairy a day, or if you are lactose intolerant take a calcium with vitamin D daily.  Your vaccines are up to date.   Discussed need for shingles vaccine and pneumonia vaccine since she is a smoker. Patient okay with getting the pneumococcal 23 vaccine  today. Handout provided about the shingles vaccine so that she can check with insurance to verify coverage first.  Due for screening mammogram. Says had it yesterday. Will look out for report.   Due for screening colonoscopy. Handout provided. She's ready to move forward with a referral then please let me know.  Tobacco abuse-cessation encouraged. Additional  handout provided. Will prescribe Nicoderm CQ. We'll start with the 40 mcg patch. Handout provided on how to slowly taper off. Also encouraged her to check out 1 800 quit now which is a support program for smoking cessation.

## 2013-09-21 NOTE — Progress Notes (Signed)
Quick Note:  All labs are normal. ______ 

## 2013-09-22 ENCOUNTER — Other Ambulatory Visit: Payer: Self-pay | Admitting: Family Medicine

## 2013-09-22 LAB — CBC
HEMATOCRIT: 48.4 % — AB (ref 36.0–46.0)
HEMOGLOBIN: 16.3 g/dL — AB (ref 12.0–15.0)
MCH: 29.5 pg (ref 26.0–34.0)
MCHC: 33.7 g/dL (ref 30.0–36.0)
MCV: 87.5 fL (ref 78.0–100.0)
Platelets: 251 10*3/uL (ref 150–400)
RBC: 5.53 MIL/uL — AB (ref 3.87–5.11)
RDW: 14.1 % (ref 11.5–15.5)
WBC: 7.4 10*3/uL (ref 4.0–10.5)

## 2013-09-23 LAB — HEPATITIS PANEL, ACUTE
HCV Ab: REACTIVE — AB
HEP B C IGM: NONREACTIVE
Hep A IgM: NONREACTIVE
Hepatitis B Surface Ag: NEGATIVE

## 2013-09-23 LAB — COMPLETE METABOLIC PANEL WITH GFR
ALBUMIN: 3.8 g/dL (ref 3.5–5.2)
ALT: 20 U/L (ref 0–35)
AST: 17 U/L (ref 0–37)
Alkaline Phosphatase: 73 U/L (ref 39–117)
BUN: 17 mg/dL (ref 6–23)
CALCIUM: 9.4 mg/dL (ref 8.4–10.5)
CO2: 23 mEq/L (ref 19–32)
CREATININE: 0.78 mg/dL (ref 0.50–1.10)
Chloride: 108 mEq/L (ref 96–112)
GFR, EST NON AFRICAN AMERICAN: 89 mL/min
GFR, Est African American: >89 mL/min
GLUCOSE: 91 mg/dL (ref 70–99)
POTASSIUM: 4.4 meq/L (ref 3.5–5.3)
Sodium: 139 mEq/L (ref 135–145)
Total Bilirubin: 0.5 mg/dL (ref 0.2–1.2)
Total Protein: 6.5 g/dL (ref 6.0–8.3)

## 2013-09-23 LAB — URINALYSIS, ROUTINE W REFLEX MICROSCOPIC
BILIRUBIN URINE: NEGATIVE
GLUCOSE, UA: NEGATIVE mg/dL
Hgb urine dipstick: NEGATIVE
Ketones, ur: NEGATIVE mg/dL
Leukocytes, UA: NEGATIVE
Nitrite: NEGATIVE
PH: 5 (ref 5.0–8.0)
Protein, ur: NEGATIVE mg/dL
Specific Gravity, Urine: 1.024 (ref 1.005–1.030)
UROBILINOGEN UA: 0.2 mg/dL (ref 0.0–1.0)

## 2013-09-23 LAB — LIPID PANEL
CHOLESTEROL: 173 mg/dL (ref 0–200)
HDL: 56 mg/dL (ref >39)
LDL Cholesterol: 102 mg/dL — ABNORMAL HIGH (ref 0–99)
TRIGLYCERIDES: 74 mg/dL (ref <150)
Total CHOL/HDL Ratio: 3.1 Ratio
VLDL: 15 mg/dL (ref 0–40)

## 2013-09-23 LAB — TSH: TSH: 1.446 u[IU]/mL (ref 0.350–4.500)

## 2013-09-23 LAB — RPR

## 2013-09-23 LAB — VITAMIN D 25 HYDROXY (VIT D DEFICIENCY, FRACTURES): Vit D, 25-Hydroxy: 41 ng/mL (ref 30–89)

## 2013-09-23 LAB — HIV ANTIBODY (ROUTINE TESTING W REFLEX): HIV 1&2 Ab, 4th Generation: NONREACTIVE

## 2013-09-23 LAB — CYTOLOGY - PAP

## 2013-09-24 ENCOUNTER — Telehealth: Payer: Self-pay | Admitting: *Deleted

## 2013-09-24 DIAGNOSIS — M7989 Other specified soft tissue disorders: Secondary | ICD-10-CM

## 2013-09-24 NOTE — Telephone Encounter (Signed)
Yes please call pt and have her go to lab for draw.

## 2013-09-24 NOTE — Telephone Encounter (Signed)
Pt informed and will go to lab in am.  Meyer CoryMisty Ahmad, LPN

## 2013-09-24 NOTE — Progress Notes (Signed)
Quick Note:  Call patient: Your Pap smear is normal. Repeat in 2-3 years. ______ 

## 2013-09-24 NOTE — Telephone Encounter (Signed)
She called stating that they didn't run the HCV because the note states "quanitity no sufficient". Do you want me to call pt to go back to lab to get more blood recollected?  Meyer CoryMisty Ahmad, LPN

## 2013-09-29 LAB — HEPATITIS C VRS RNA DETECT BY PCR-QUAL: Hepatitis C Vrs RNA by PCR-Qual: NEGATIVE

## 2013-10-05 ENCOUNTER — Telehealth: Payer: Self-pay | Admitting: *Deleted

## 2013-10-05 DIAGNOSIS — M25472 Effusion, left ankle: Secondary | ICD-10-CM

## 2013-10-05 DIAGNOSIS — M7989 Other specified soft tissue disorders: Secondary | ICD-10-CM

## 2013-10-05 NOTE — Telephone Encounter (Signed)
Pt is c/o something is going on with her ankle it is swollen and feels like something is moving around in it

## 2013-10-08 ENCOUNTER — Other Ambulatory Visit: Payer: Self-pay | Admitting: *Deleted

## 2013-10-08 ENCOUNTER — Ambulatory Visit (INDEPENDENT_AMBULATORY_CARE_PROVIDER_SITE_OTHER): Payer: BC Managed Care – PPO

## 2013-10-08 DIAGNOSIS — M7730 Calcaneal spur, unspecified foot: Secondary | ICD-10-CM

## 2013-10-08 DIAGNOSIS — M7989 Other specified soft tissue disorders: Secondary | ICD-10-CM

## 2013-10-08 DIAGNOSIS — M25472 Effusion, left ankle: Secondary | ICD-10-CM

## 2013-10-08 DIAGNOSIS — M79609 Pain in unspecified limb: Secondary | ICD-10-CM

## 2013-10-09 LAB — D-DIMER, QUANTITATIVE: D-Dimer, Quant: 0.48 ug{FEU}/mL (ref 0.00–0.48)

## 2013-10-15 ENCOUNTER — Telehealth: Payer: Self-pay | Admitting: *Deleted

## 2013-10-15 NOTE — Telephone Encounter (Signed)
Pt is calling about her results. Will forward to Dr. Karie Schwalbe for f/u.Kristen PacasBarkley, Genevieve Ritzel ElloreeLynetta

## 2013-10-15 NOTE — Telephone Encounter (Signed)
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  If painful would probably be amenable to interventional treatment (injection).  She should see me tomorrow or whenever she wants.

## 2013-10-16 NOTE — Telephone Encounter (Signed)
lvm with results and recommendations.Kristen Fletcher, Kristen Fletcher

## 2014-07-06 ENCOUNTER — Ambulatory Visit (INDEPENDENT_AMBULATORY_CARE_PROVIDER_SITE_OTHER): Payer: BLUE CROSS/BLUE SHIELD | Admitting: Physician Assistant

## 2014-07-06 ENCOUNTER — Ambulatory Visit (INDEPENDENT_AMBULATORY_CARE_PROVIDER_SITE_OTHER): Payer: BLUE CROSS/BLUE SHIELD

## 2014-07-06 VITALS — BP 105/60 | HR 69 | Wt 195.0 lb

## 2014-07-06 DIAGNOSIS — M349 Systemic sclerosis, unspecified: Secondary | ICD-10-CM

## 2014-07-06 DIAGNOSIS — M25572 Pain in left ankle and joints of left foot: Secondary | ICD-10-CM

## 2014-07-06 DIAGNOSIS — L209 Atopic dermatitis, unspecified: Secondary | ICD-10-CM | POA: Diagnosis not present

## 2014-07-06 DIAGNOSIS — B351 Tinea unguium: Secondary | ICD-10-CM | POA: Diagnosis not present

## 2014-07-06 DIAGNOSIS — M19072 Primary osteoarthritis, left ankle and foot: Secondary | ICD-10-CM

## 2014-07-06 MED ORDER — TRIAMCINOLONE ACETONIDE 0.1 % EX CREA: 1.0000 "application " | TOPICAL_CREAM | Freq: Two times a day (BID) | CUTANEOUS | Status: DC

## 2014-07-06 MED ORDER — TERBINAFINE HCL 250 MG PO TABS: 250.0000 mg | ORAL_TABLET | Freq: Every day | ORAL | Status: DC

## 2014-07-06 MED ORDER — MELOXICAM 15 MG PO TABS: ORAL_TABLET | ORAL | Status: DC

## 2014-07-06 NOTE — Patient Instructions (Signed)
mobic for next 2 weeks.  Ice ankle regularly.  Wear ankle brace.  2 times daily exercises.  Get xray .  lamisil for fungus.  triamcinolone for dry itchy skin.   Chronic Ankle Instability with Rehab Chronic ankle instability is characterized by instability of the ankle for a prolonged period of time. There are two types of ankle instability.   A functionally unstable ankle is one that gives way; however, it may or may not be loose.  A mechanically unstable ankle is one that is loose due to a problem with the ligaments. However, not all loose ankles are unstable or give way. SYMPTOMS   Recurrent ankle pain and giving way of the ankle.  Difficulty running on uneven surfaces, jumping, or changing directions while running (cutting).  Pain, tenderness, swelling, and bruising at the site of injury.  Weakness or looseness in the ankle joint.  Occasionally, impaired ability to walk soon after injury. CAUSES   Ankle instability is most commonly caused by a previous ankle injury that did not completely heal.  Ankle instability may also be caused by stress imposed from either side of the ankle joint that can temporarily force or pry the ankle bone (talus) out of its normal alignment. The ligaments that hold the joint in place are stretched and torn. RISK INCREASES WITH:  Previous ankle injury.  You were born with (congenital) joint looseness.  Too-rapid return to activity after previous ankle sprain.  Activities in which the foot may land sideways while running, walking, and jumping (basketball, volleyball, or soccer) or walking or running on uneven or rough surfaces.  Inadequate ankle support during athletics.  Poor strength and flexibility.  Poor balance skills. PREVENTION  Warm up and stretch properly before activity.  Maintain physical fitness:  Ankle and leg flexibility, muscle strength, and endurance.  Balanced training activities.  Cardiovascular fitness.  Learn  and use proper technique during sports and have a coach correct improper technique.  Taping, protective strapping, bracing, or high-top tennis shoes may be used. Initially, tape is best; however, it loses most of its support function within 10 to 15 minutes.  Wear proper protective shoes (high-top shoes with taping or bracing).  Provide the ankle with support during sports and practice activities for 12 months following injury.  Complete rehabilitation after initial injury. PROGNOSIS  If treated properly, ankle instability normally resolves with non-surgical treatment. However, for certain cases of mechanical instability surgery is necessary. RELATED COMPLICATIONS   Frequent recurrence of symptoms is possible. Following rehabilitation guidelines correctly decreases the frequency of recurrence and optimizes healing time.  Injury to other structures (bone, cartilage, or tendon).  Chronically unstable or arthritic ankle joint.  Complications of surgery including infection, bleeding, injury to nerves, continued giving way, ankle stiffness, and ankle weakness. TREATMENT Treatment initially involves ice, medication, and compression bandages are used to help reduce pain and inflammation, It may be necessary to immobilize the joint for a period of time to allow for healing. Strengthening and stretching exercises are recommended after immobilization to help regain strength and flexibility. These exercises may be completed at home or with a therapist. Some individuals find placing a heel wedge in the shoe, taping or bracing, and wearing high-top shoes helpful. If symptoms last for longer than 3 months, despite treatment, then surgery may be recommended. HEAT AND COLD  Cold treatment (icing) relieves pain and reduces inflammation. Cold treatment should be applied for 10 to 15 minutes every 2 to 3 hours for inflammation and pain and  immediately after any activity that aggravates your symptoms. Use ice  packs or an ice massage.  Heat treatment may be used prior to performing the stretching and strengthening activities prescribed by your caregiver, physical therapist, or athletic trainer. Use a heat pack or a warm soak. MEDICATION   There are no specific medications to improve the stability of your ankle.  If pain medication is necessary, then nonsteroidal anti-inflammatory medications, such as aspirin and ibuprofen, or other minor pain relievers, such as acetaminophen, are often recommended.  Do not take pain medication within 7 days before surgery.  Prescription pain relievers may be prescribed if deemed necessary by your caregiver. Use only as directed and only as much as you need.  Ointments applied to the skin may be helpful. SEEK MEDICAL CARE IF:   Pain, swelling, or bruising worsens despite treatment.  You develop locking or catching in the ankle.  You have pain, numbness, or coldness in the foot.  You develop giving way of the ankle which persists after 3 to 6 months of rehabilitation. EXERCISES  RANGE OF MOTION AND STRETCHING EXERCISES - Ankle Instability, Chronic, Non-Surgical Intervention Since ankles demonstrate instability when they have too much motion throughout the joints, range of motion and stretching exercises are not helpful and can even be harmful. Only complete range of motion and stretching exercises for your ankle if instructed by your physician, physical therapist or athletic trainer. An effective rehabilitation program for unstable ankles will include mostly strengthening and balance exercises. STRENGTHENING EXERCISES - Ankle Instability, Chronic, Non-Surgical Intervention  These exercises may help you when beginning to rehabilitate your injury. They may resolve your symptoms with or without further involvement from your physician, physical therapist or athletic trainer. While completing these exercises, remember:  Muscles can gain both the endurance and the  strength needed for everyday activities through controlled exercises.  Complete these exercises as instructed by your physician, physical therapist or athletic trainer. Progress the resistance and repetitions only as guided.  You may experience muscle soreness or fatigue, but the pain or discomfort you are trying to eliminate should never worsen during these exercises. If this pain does worsen, stop and make certain you are following the directions exactly. If the pain is still present after adjustments, discontinue the exercise until you can discuss the trouble with your clinician. STRENGTH - Dorsiflexors  Secure a rubber exercise band/tubing to a fixed object (table, pole) and loop the other end around your right / left foot.  Sit on the floor facing the fixed object. The band/tubing should be slightly tense when your foot is relaxed.  Slowly draw your foot back toward you using your ankle and toes.  Hold this position for __________ seconds. Slowly release the tension in the band and return your foot to the starting position. Repeat __________ times. Complete this exercise __________ times per day.  STRENGTH - Plantar-flexors  Sit with your right / left leg extended. Holding onto both ends of a rubber exercise band/tubing, loop it around the ball of your foot. Keep a slight tension in the band.  Slowly push your toes away from you, pointing them downward.  Hold this position for __________ seconds. Return slowly, controlling the tension in the band/tubing. Repeat __________ times. Complete this exercise __________ times per day.  STRENGTH - Plantar-flexors, Standing   Stand with your feet shoulder width apart. Steady yourself with a wall or table using as little support as needed.  Keeping your weight evenly spread over the width of  your feet, rise up on your toes.*  Hold this position for __________ seconds. Repeat __________ times. Complete this exercise __________ times per day.    *If this is too easy, shift your weight toward your right / left leg until you feel challenged. Ultimately, you may be asked to do this exercise with your right / left foot only. STRENGTH - Ankle Eversion  Secure one end of a rubber exercise band/tubing to a fixed object (table, pole). Loop the other end around your foot just before your toes.  Place your fists between your knees. This will focus your strengthening at your ankle.  Drawing the band/tubing across your opposite foot, slowly, pull your little toe out and up. Make sure the band/tubing is positioned to resist the entire motion.  Hold this position for __________ seconds.  Have your muscles resist the band/tubing as it slowly pulls your foot back to the starting position. Repeat __________ times. Complete this exercise __________ times per day.  STRENGTH - Ankle Inversion  Secure one end of a rubber exercise band/tubing to a fixed object (table, pole). Loop the other end around your foot just before your toes.  Place your fists between your knees. This will focus your strengthening at your ankle.  Slowly, pull your big toe up and in, making sure the band/tubing is positioned to resist the entire motion.  Hold this position for __________ seconds.  Have your muscles resist the band/tubing as it slowly pulls your foot back to the starting position. Repeat __________ times. Complete this exercises __________ times per day.  STRENGTH - Towel Curls  Sit in a chair positioned on a non-carpeted surface.  Place your foot on a towel, keeping your heel on the floor.  Pull the towel toward your heel by only curling your toes. Keep your heel on the floor.  If instructed by your physician, physical therapist or athletic trainer, add weight to the end of the towel. Repeat __________ times. Complete this exercise __________ times per day. STRENGTH - Dorsiflexors and Plantar-flexors, Heel/toe Walking  Dorsiflexion: Walk on your heels  only. Keep your toes as high as possible.  Repeat __________ times. Complete __________ times per day.  Plantar flexion: Walk on your toes only. Keep your heels as high as possible.  Walk for ____________________ seconds/feet. Repeat __________ times. Complete __________ times per day.  BALANCE - Tandem Walking  Place your uninjured foot on a line 2-4 inches wide and at least 10 feet long.  Keeping your balance without using anything for extra support, place your right / left heel directly in front of your other foot.  Slowly raise your back foot up, lifting from the heel to the toes, and place it directly in front of the right / left foot.  Continue to walk along the line slowly. Walk for ____________________ feet. Repeat ____________________ times. Complete ____________________ times per day. BALANCE - Inversion/Eversion Use caution, these are advanced level exercises. Do not begin them until you are advised to do so.   Create a balance board using a sturdy board about 1  feet long and at 1-1  feet wide and a 1  inch diameter rod or pipe that is as long as the board's width. A copper pipe or a solid broomstick work well.  Stand on a non-carpeted surface near a countertop or wall. Step onto the board so that your feet are hip-width apart and equally straddle the rod/pipe.  Keeping your feet in place, complete these two exercises without  shifting your upper body or hips:  Tip the board from side-to-side. Control the movement so the board does not forcefully strike the ground. The board should silently tap the ground.  Tip the board side-to-side without striking the ground. Occasionally pause and maintain a steady position at various points.  Repeat the first two exercises, but use only your right / left foot. Place your right / left foot directly over the rod/pipe. Repeat __________ times. Complete this exercise __________ times a day. BALANCE - Plantar/Dorsi Flexion Use caution,  these are advanced level exercises. Do not begin them until you are advised to do so.  Create a balance board using a sturdy board about 1  feet long and at 1-1  feet wide and a 1  inch diameter rod or pipe that is as long as the board's width. A copper pipe or a solid broomstick work well.  Stand on a non-carpeted surface near a countertop or wall. Stand on the board so that the rod/pipe runs under the arches in your feet.  Keeping your feet in place, complete these two exercises without shifting your upper body or hips:  Tip the board from side-to-side. Control the movement so the board does not forcefully strike the ground. The board should silently tap the ground.  Tip the board side-to-side without striking the ground. Occasionally pause and maintain a steady position at various points.  Repeat the first two exercises, but use only your right / left foot. Stand in the center of the board. Repeat __________ times. Complete this exercise __________ times a day. STRENGTH - Plantar-flexors, Eccentric Note: This exercise can place a lot of stress on your foot and ankle. Please complete this exercise only if specifically instructed by your caregiver.   Place the balls of your feet on a step. With your hands, use only enough support from a wall or rail to keep your balance.  Keep your knees straight and rise up on your toes.  Slowly shift your weight entirely to your toes and pick up your opposite foot. Gently and with controlled movement, lower your weight through your right / left foot so that your heel drops below the level of the step. You will feel a slight stretch in the back of your calf at the ending position.  Use the healthy leg to help rise up onto the balls of both feet, then lower weight only on the right / left leg again. Build up to 15 repetitions. Then progress to 3 consecutive sets of 15 repetitions.*  After completing the above exercise, complete the same exercise with a  slight knee bend (about 30 degrees). Again, build up to 15 repetitions. Then progress to 3 consecutive sets of 15 repetitions.* Perform this exercise __________ times per day.  *When you easily complete 3 sets of 15, your physician, physical therapist or athletic trainer may advise you to add resistance by wearing a backpack filled with additional weight. Document Released: 10/25/2004 Document Revised: 06/18/2011 Document Reviewed: 07/08/2008 [facility] Patient Information 2015 [place], [place]. This information is not intended to replace advice given to you by your health care provider. Make sure you discuss any questions you have with your health care provider.

## 2014-07-07 DIAGNOSIS — M19072 Primary osteoarthritis, left ankle and foot: Secondary | ICD-10-CM | POA: Insufficient documentation

## 2014-07-07 LAB — COMPLETE METABOLIC PANEL WITH GFR
ALBUMIN: 4.2 g/dL (ref 3.5–5.2)
ALT: 20 U/L (ref 0–35)
AST: 17 U/L (ref 0–37)
Alkaline Phosphatase: 87 U/L (ref 39–117)
BUN: 16 mg/dL (ref 6–23)
CHLORIDE: 105 meq/L (ref 96–112)
CO2: 29 meq/L (ref 19–32)
CREATININE: 0.68 mg/dL (ref 0.50–1.10)
Calcium: 9.6 mg/dL (ref 8.4–10.5)
GFR, Est African American: >89 mL/min
GFR, Est Non African American: >89 mL/min
Glucose, Bld: 89 mg/dL (ref 70–99)
POTASSIUM: 4.9 meq/L (ref 3.5–5.3)
SODIUM: 140 meq/L (ref 135–145)
TOTAL PROTEIN: 7.4 g/dL (ref 6.0–8.3)
Total Bilirubin: 0.3 mg/dL (ref 0.2–1.2)

## 2014-07-07 NOTE — Progress Notes (Signed)
   Subjective:    Patient ID: Kristen Fletcher, female    DOB: April 30, 1965, 49 y.o.   MRN: [id number]  HPI Pt presents to the clinic for further evaluation of left ankle pain. Been present for over a year. Pt is very tired of pain. She feel like her pain is keeping her from living life. 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 No known trauma. Left ankle will just give out at times. Seems to be hurting more than usual. Not taking anything except occasional ibuprofen for pain.    Review of Systems  All other systems reviewed and are negative.      Objective:   Physical Exam  Constitutional: She is oriented to person, place, and time. She appears well-developed and well-nourished.  Musculoskeletal:  Full ROM of left ankle and foot.  Strength 5/5.  Some tenderness over midfoot to palpaiton and medial ankle.  Good plantar and dorsiflexion.  No pain to palpaiton over fascia.  Left ankle does seem like has some laxity.   Neurological: She is alert and oriented to person, place, and time.  Skin: Skin is dry.  Bilateral yellow thicken great toenails.   Area of erythema and dry thickened skin on anterior right ankle and anterior shin.   Psychiatric: She has a normal mood and affect. Her behavior is normal.          Assessment & Plan:  Left ankle pain- there does seem to be some laxity in left ankle. Fitted for ankle stabilzer lace up. Pt tried it on and then got frustrated and denied brace. Will get xrays. Discussed been one year since xrays need to repeat and then if pain continuing consider MRI. i would like for patient to see Dr. 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 Try mobic daily at least for next 2 weeks.   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 lamisil given for 12 weeks. Discussed slow growing and dying nature of fungus.   Atopic dermatitis- triamcinolone cream given to try bid for next 2 weeks. Discussed  using cetaphil/aveeno/eurcerin on legs.   Pt was overly emotionally. She reports she is tired of being in pain. She seemed like she wanted a treatment plan but then she would not support part of the plan. Tried to get her in withDr. T tomorrow but pt declined. I do think depression plays a role in her pain.

## 2014-07-27 ENCOUNTER — Encounter: Payer: Self-pay | Admitting: Family Medicine

## 2014-08-02 ENCOUNTER — Other Ambulatory Visit: Payer: Self-pay | Admitting: Podiatry

## 2014-08-02 DIAGNOSIS — M25572 Pain in left ankle and joints of left foot: Secondary | ICD-10-CM

## 2014-08-02 DIAGNOSIS — I7300 Raynaud's syndrome without gangrene: Secondary | ICD-10-CM

## 2014-08-03 ENCOUNTER — Institutional Professional Consult (permissible substitution): Payer: BLUE CROSS/BLUE SHIELD | Admitting: Sports Medicine

## 2014-08-04 ENCOUNTER — Encounter: Payer: Self-pay | Admitting: Family Medicine

## 2014-08-04 ENCOUNTER — Ambulatory Visit (INDEPENDENT_AMBULATORY_CARE_PROVIDER_SITE_OTHER): Payer: BLUE CROSS/BLUE SHIELD | Admitting: Family Medicine

## 2014-08-04 ENCOUNTER — Other Ambulatory Visit (HOSPITAL_COMMUNITY)
Admission: RE | Admit: 2014-08-04 | Discharge: 2014-08-04 | Disposition: A | Discharge status: Home / Self Care | Payer: BLUE CROSS/BLUE SHIELD | Source: Ambulatory Visit | Attending: Family Medicine | Admitting: Family Medicine

## 2014-08-04 VITALS — BP 109/63 | HR 113 | Ht 67.0 in | Wt 193.0 lb

## 2014-08-04 DIAGNOSIS — Z01419 Encounter for gynecological examination (general) (routine) without abnormal findings: Secondary | ICD-10-CM

## 2014-08-04 LAB — COMPLETE METABOLIC PANEL WITH GFR
ALT: 20 U/L (ref 0–35)
AST: 19 U/L (ref 0–37)
Albumin: 4.1 g/dL (ref 3.5–5.2)
Alkaline Phosphatase: 81 U/L (ref 39–117)
BILIRUBIN TOTAL: 0.3 mg/dL (ref 0.2–1.2)
BUN: 24 mg/dL — ABNORMAL HIGH (ref 6–23)
CHLORIDE: 104 meq/L (ref 96–112)
CO2: 25 mEq/L (ref 19–32)
Calcium: 9.4 mg/dL (ref 8.4–10.5)
Creat: 0.77 mg/dL (ref 0.50–1.10)
GFR, Est African American: >89 mL/min
GFR, Est Non African American: >89 mL/min
GLUCOSE: 91 mg/dL (ref 70–99)
POTASSIUM: 4.4 meq/L (ref 3.5–5.3)
SODIUM: 138 meq/L (ref 135–145)
Total Protein: 7.2 g/dL (ref 6.0–8.3)

## 2014-08-04 LAB — LIPID PANEL
CHOL/HDL RATIO: 3 ratio
CHOLESTEROL: 181 mg/dL (ref 0–200)
HDL: 60 mg/dL (ref >=46)
LDL Cholesterol: 108 mg/dL — ABNORMAL HIGH (ref 0–99)
Triglycerides: 63 mg/dL (ref <150)
VLDL: 13 mg/dL (ref 0–40)

## 2014-08-04 NOTE — Progress Notes (Signed)
  Subjective:     Kristen BeechamCrystal Fletcher is a 49 y.o. female and is here for a comprehensive physical exam. The patient reports problems - recently seen UC for foot cellulitis. given ABX. returned and foot was not better. Sent to podiatry. They feel more vascular. Sched MRI on Friday and being referred to vasuclar..  History   Social History  . Marital Status: Divorced    Spouse Name: N/A  . Number of Children: N/A  . Years of Education: N/A   Occupational History  . works form home    Social History Main Topics  . Smoking status: Current Every Day Smoker -- 0.50 packs/day for 30 years    Types: Cigarettes  . Smokeless tobacco: Not on file  . Alcohol Use: No  . Drug Use: No  . Sexual Activity: Not on file   Other Topics Concern  . Not on file   Social History Narrative   Health Maintenance  Topic Date Due  . COLONOSCOPY  09/04/2013  . INFLUENZA VACCINE  11/08/2014  . MAMMOGRAM  09/18/2015  . PAP SMEAR  09/18/2016  . TETANUS/TDAP  09/23/2021  . Hepatitis C Screening  Completed  . HIV Screening  Completed    The following portions of the patient's history were reviewed and updated as appropriate: allergies, current medications, past family history, past medical history, past social history, past surgical history and problem list.  Review of Systems A comprehensive review of systems was negative.   Objective:    BP 109/63 mmHg  Pulse 113  Ht 5\' 7"  (1.702 m)  Wt 193 lb (87.544 kg)  BMI 30.22 kg/m2 General appearance: alert, cooperative and appears stated age Head: Normocephalic, without obvious abnormality, atraumatic Eyes: conj clear, EOMI, PEERLA Ears: normal TM's and external ear canals both ears Nose: Nares normal. Septum midline. Mucosa normal. No drainage or sinus tenderness. Throat: lips, mucosa, and tongue normal; teeth and gums normal Neck: no adenopathy, no carotid bruit, no JVD, supple, symmetrical, trachea midline and thyroid not enlarged, symmetric, no  tenderness/mass/nodules Back: symmetric, no curvature. ROM normal. No CVA tenderness. Lungs: clear to auscultation bilaterally Breasts: normal appearance, no masses or tenderness Heart: regular rate and rhythm, S1, S2 normal, no murmur, click, rub or gallop Abdomen: soft, non-tender; bowel sounds normal; no masses,  no organomegaly Pelvic: cervix normal in appearance, external genitalia normal, no adnexal masses or tenderness, no cervical motion tenderness, rectovaginal septum normal, uterus normal size, shape, and consistency and vagina normal without discharge Extremities: clubbing of digits Pulses: 2+ and symmetric Skin: Skin color, texture, turgor normal. No rashes or lesions Lymph nodes: Cervical, supraclavicular, and axillary nodes normal. Neurologic: Alert and oriented X 3, normal strength and tone. Normal symmetric reflexes. Normal coordination and gait    Assessment:    Healthy female exam.     Plan:     See After Visit Summary for Counseling Recommendations   Keep up a regular exercise program and make sure you are eating a healthy diet Try to eat 4 servings of dairy a day, or if you are lactose intolerant take a calcium with vitamin D daily.  Your vaccines are up to date.  Due for screening colonoscopy.

## 2014-08-04 NOTE — Patient Instructions (Signed)
complete physical examination Keep up a regular exercise program and make sure you are eating a healthy diet Try to eat 4 servings of dairy a day, or if you are lactose intolerant take a calcium with vitamin D daily.  Your vaccines are up to date.   

## 2014-08-06 ENCOUNTER — Ambulatory Visit
Admission: RE | Admit: 2014-08-06 | Discharge: 2014-08-06 | Disposition: A | Discharge status: Home / Self Care | Payer: BLUE CROSS/BLUE SHIELD | Source: Ambulatory Visit | Attending: Podiatry | Admitting: Podiatry

## 2014-08-06 DIAGNOSIS — M25572 Pain in left ankle and joints of left foot: Secondary | ICD-10-CM

## 2014-08-06 DIAGNOSIS — I7300 Raynaud's syndrome without gangrene: Secondary | ICD-10-CM

## 2014-08-06 LAB — CYTOLOGY - PAP

## 2014-08-06 NOTE — Progress Notes (Signed)
Quick Note:  Call patient: Your Pap smear is normal. Repeat in 3 years. ______ 

## 2015-11-02 ENCOUNTER — Encounter (HOSPITAL_COMMUNITY): Payer: Self-pay | Admitting: Emergency Medicine

## 2015-11-02 ENCOUNTER — Ambulatory Visit (HOSPITAL_COMMUNITY)
Admission: EM | Admit: 2015-11-02 | Discharge: 2015-11-02 | Disposition: A | Discharge status: Home / Self Care | Payer: Self-pay | Attending: Family Medicine | Admitting: Family Medicine

## 2015-11-02 DIAGNOSIS — G540 Brachial plexus disorders: Secondary | ICD-10-CM

## 2015-11-02 MED ORDER — METHYLPREDNISOLONE 4 MG PO TBPK
ORAL_TABLET | ORAL | 0 refills | Status: DC
Start: 2015-11-02 — End: 2020-07-07

## 2015-11-02 NOTE — ED Triage Notes (Signed)
The patient presented to the [facility] with a complaint of left sided chest pain under her left arm and tingling and pain in her left arm. She stated that the chest pain increases when she takes a breath. The patient stated that the chest wall pain has been ongoing for 3 weeks but the arm tingling started 3 days ago.

## 2015-11-02 NOTE — ED Provider Notes (Signed)
CSN: [id number]     Arrival date & time 11/02/15  1031 History   None    Chief Complaint  Patient presents with  . Pleurisy  . Arm Pain   (Consider location/radiation/quality/duration/timing/severity/associated sxs/prior Treatment) Patient c/o left sided chest and arm pain x 3 days.  She states her left hand goes numb if she raises up her left arm.   The history is provided by the patient.  Arm Pain  This is a new problem. The current episode started more than 2 days ago. The problem occurs constantly. The problem has not changed since onset.Associated symptoms include chest pain. Nothing aggravates the symptoms. Nothing relieves the symptoms. She has tried nothing for the symptoms.    Past Medical History:  Diagnosis Date  . Scleroderma (HCC)    Dr Norina Buzzard   Past Surgical History:  Procedure Laterality Date  . CERVICAL DISC SURGERY  2005   Family History  Problem Relation Age of Onset  . Heart attack Father     early 65's  . Breast cancer Mother   . Diabetes Maternal Grandmother    Social History  Substance Use Topics  . Smoking status: Current Every Day Smoker    Packs/day: 0.50    Years: 30.00    Types: Cigarettes  . Smokeless tobacco: Never Used  . Alcohol use No   OB History    No data available     Review of Systems  Constitutional: Negative.   HENT: Negative.   Eyes: Negative.   Respiratory: Negative.   Cardiovascular: Positive for chest pain.  Endocrine: Negative.   Genitourinary: Negative.   Musculoskeletal: Positive for joint swelling and myalgias.  Skin: Negative.   Allergic/Immunologic: Negative.   Neurological: Positive for numbness.  Hematological: Negative.   Psychiatric/Behavioral: Negative.     Allergies  Review of patient's allergies indicates no known allergies.  Home Medications   Prior to Admission medications   Medication Sig Start Date End Date Taking? Authorizing Provider  aspirin 325 MG tablet Take 650 mg by mouth daily.    Yes Historical Provider, MD  cephALEXin (KEFLEX) 500 MG capsule Take 500 mg by mouth 3 (three) times daily. 07/28/14   Historical Provider, MD  meloxicam (MOBIC) 15 MG tablet One tab PO qAM with breakfast for 2 weeks, then daily prn pain. 07/06/14   Jomarie Longs, PA-C  methylPREDNISolone (MEDROL DOSEPAK) 4 MG TBPK tablet Take 6-5-4-3-2-1 po qd 11/02/15   Deatra Canter, FNP  Multiple Vitamin (MULTIVITAMIN) tablet Take 1 tablet by mouth daily.    Historical Provider, MD  triamcinolone cream (KENALOG) 0.1 % Apply 1 application topically 2 (two) times daily. 07/06/14   Jomarie Longs, PA-C   Meds Ordered and Administered this Visit  Medications - No data to display  BP 115/72 (BP Location: Right Arm)   Pulse (!) 57   Temp 98.1 F (36.7 C) (Oral)   Resp 18   SpO2 95%  No data found.   Physical Exam  Constitutional: She appears well-developed and well-nourished.  HENT:  Head: Normocephalic and atraumatic.  Eyes: EOM are normal. Pupils are equal, round, and reactive to light.  Neck: Normal range of motion. Neck supple.  Cardiovascular: Normal rate, regular rhythm and normal heart sounds.   Pulmonary/Chest: Effort normal and breath sounds normal.  Musculoskeletal: She exhibits tenderness.  Patient with discomfort raising left arm up. She loses pulse and her right hand develops palor and when arm  Brought back down at waist level her  pulse returns and numbness goes away.    Urgent Care Course   Clinical Course    Procedures (including critical care time)  Labs Review Labs Reviewed - No data to display  Imaging Review No results found.   Visual Acuity Review  Right Eye Distance:   Left Eye Distance:   Bilateral Distance:    Right Eye Near:   Left Eye Near:    Bilateral Near:         MDM   1. Thoracic outlet syndrome    Medrol dose pack as directed. Advised patient to follow up prn    Deatra Canter, FNP 11/02/15 1247

## 2019-12-15 ENCOUNTER — Telehealth: Payer: Self-pay

## 2019-12-15 NOTE — Telephone Encounter (Signed)
Kristen Fletcher called wanting to re-establish care with [name]. Her insurance is a [facility] insurance. I advised her we are out of network.

## 2020-07-07 ENCOUNTER — Encounter (HOSPITAL_COMMUNITY): Payer: Self-pay | Admitting: Pharmacy Technician

## 2020-07-07 ENCOUNTER — Inpatient Hospital Stay (HOSPITAL_COMMUNITY)
Admission: EM | Admit: 2020-07-07 | Discharge: 2020-07-11 | DRG: 546 | Disposition: A | Discharge status: Home / Self Care | Payer: BLUE CROSS/BLUE SHIELD | Attending: Internal Medicine | Admitting: Internal Medicine

## 2020-07-07 ENCOUNTER — Emergency Department (HOSPITAL_COMMUNITY): Payer: BLUE CROSS/BLUE SHIELD

## 2020-07-07 ENCOUNTER — Other Ambulatory Visit: Payer: Self-pay

## 2020-07-07 DIAGNOSIS — Z79899 Other long term (current) drug therapy: Secondary | ICD-10-CM

## 2020-07-07 DIAGNOSIS — Z833 Family history of diabetes mellitus: Secondary | ICD-10-CM

## 2020-07-07 DIAGNOSIS — Z8249 Family history of ischemic heart disease and other diseases of the circulatory system: Secondary | ICD-10-CM

## 2020-07-07 DIAGNOSIS — Z20822 Contact with and (suspected) exposure to covid-19: Secondary | ICD-10-CM | POA: Diagnosis present

## 2020-07-07 DIAGNOSIS — F17210 Nicotine dependence, cigarettes, uncomplicated: Secondary | ICD-10-CM | POA: Diagnosis present

## 2020-07-07 DIAGNOSIS — I96 Gangrene, not elsewhere classified: Secondary | ICD-10-CM | POA: Diagnosis present

## 2020-07-07 DIAGNOSIS — L089 Local infection of the skin and subcutaneous tissue, unspecified: Secondary | ICD-10-CM

## 2020-07-07 DIAGNOSIS — M3483 Systemic sclerosis with polyneuropathy: Secondary | ICD-10-CM | POA: Diagnosis present

## 2020-07-07 DIAGNOSIS — M349 Systemic sclerosis, unspecified: Secondary | ICD-10-CM | POA: Diagnosis present

## 2020-07-07 DIAGNOSIS — Z6841 Body Mass Index (BMI) 40.0 and over, adult: Secondary | ICD-10-CM

## 2020-07-07 DIAGNOSIS — I7301 Raynaud's syndrome with gangrene: Secondary | ICD-10-CM | POA: Diagnosis not present

## 2020-07-07 DIAGNOSIS — Z803 Family history of malignant neoplasm of breast: Secondary | ICD-10-CM

## 2020-07-07 LAB — CBC WITH DIFFERENTIAL/PLATELET
Abs Immature Granulocytes: 0.03 10*3/uL (ref 0.00–0.07)
Basophils Absolute: 0.1 10*3/uL (ref 0.0–0.1)
Basophils Relative: 1 %
Eosinophils Absolute: 0.1 10*3/uL (ref 0.0–0.5)
Eosinophils Relative: 1 %
HCT: 43.7 % (ref 36.0–46.0)
Hemoglobin: 14.1 g/dL (ref 12.0–15.0)
Immature Granulocytes: 0 %
Lymphocytes Relative: 22 %
Lymphs Abs: 1.9 10*3/uL (ref 0.7–4.0)
MCH: 28.6 pg (ref 26.0–34.0)
MCHC: 32.3 g/dL (ref 30.0–36.0)
MCV: 88.6 fL (ref 80.0–100.0)
Monocytes Absolute: 0.8 10*3/uL (ref 0.1–1.0)
Monocytes Relative: 9 %
Neutro Abs: 6 10*3/uL (ref 1.7–7.7)
Neutrophils Relative %: 67 %
Platelets: 272 10*3/uL (ref 150–400)
RBC: 4.93 MIL/uL (ref 3.87–5.11)
RDW: 15.5 % (ref 11.5–15.5)
WBC: 8.9 10*3/uL (ref 4.0–10.5)
nRBC: 0 % (ref 0.0–0.2)

## 2020-07-07 LAB — COMPREHENSIVE METABOLIC PANEL
ALT: 27 U/L (ref 0–44)
AST: 26 U/L (ref 15–41)
Albumin: 3.2 g/dL — ABNORMAL LOW (ref 3.5–5.0)
Alkaline Phosphatase: 71 U/L (ref 38–126)
Anion gap: 7 (ref 5–15)
BUN: 14 mg/dL (ref 6–20)
CO2: 22 mmol/L (ref 22–32)
Calcium: 9 mg/dL (ref 8.9–10.3)
Chloride: 109 mmol/L (ref 98–111)
Creatinine, Ser: 0.85 mg/dL (ref 0.44–1.00)
GFR, Estimated: >60 mL/min (ref >60)
Glucose, Bld: 105 mg/dL — ABNORMAL HIGH (ref 70–99)
Potassium: 4 mmol/L (ref 3.5–5.1)
Sodium: 138 mmol/L (ref 135–145)
Total Bilirubin: 0.6 mg/dL (ref 0.3–1.2)
Total Protein: 6.7 g/dL (ref 6.5–8.1)

## 2020-07-07 LAB — I-STAT BETA HCG BLOOD, ED (MC, WL, AP ONLY): I-stat hCG, quantitative: 7.5 m[IU]/mL — ABNORMAL HIGH (ref <5)

## 2020-07-07 MED ORDER — VANCOMYCIN HCL 750 MG/150ML IV SOLN
750.0000 mg | Freq: Two times a day (BID) | INTRAVENOUS | Status: DC
  Administered 2020-07-08 – 2020-07-10 (×6): 750 mg via INTRAVENOUS
  Filled 2020-07-07 (×7): qty 150

## 2020-07-07 MED ORDER — MORPHINE SULFATE (PF) 4 MG/ML IV SOLN
4.0000 mg | Freq: Once | INTRAVENOUS | Status: AC
  Administered 2020-07-07: 4 mg via INTRAVENOUS
  Filled 2020-07-07: qty 1

## 2020-07-07 MED ORDER — IBUPROFEN 800 MG PO TABS
800.0000 mg | ORAL_TABLET | Freq: Four times a day (QID) | ORAL | Status: DC | PRN
  Administered 2020-07-08 – 2020-07-09 (×2): 800 mg via ORAL
  Filled 2020-07-07 (×2): qty 1

## 2020-07-07 MED ORDER — VANCOMYCIN HCL 10 G IV SOLR
1750.0000 mg | Freq: Once | INTRAVENOUS | Status: AC
  Administered 2020-07-07: 1750 mg via INTRAVENOUS
  Filled 2020-07-07: qty 500

## 2020-07-07 MED ORDER — GABAPENTIN 300 MG PO CAPS
300.0000 mg | ORAL_CAPSULE | Freq: Three times a day (TID) | ORAL | Status: DC
  Administered 2020-07-08 – 2020-07-11 (×11): 300 mg via ORAL
  Filled 2020-07-07 (×11): qty 1

## 2020-07-07 NOTE — ED Provider Notes (Signed)
[facility] EMERGENCY DEPARTMENT Provider Note   CSN: [id number] Arrival date & time: 07/07/20  1746     History Chief Complaint  Patient presents with  . Foot Pain    Kristen Fletcher is a 55 y.o. female.  Patient presents to the emergency department with a chief complaint of left toe pain.  She has history of scleroderma.  Reports that she has been fighting "gangrene" in her fourth and fifth toes for quite some time.  She states that now she is concerned that it is spreading to her left big toe.  She reports new redness, swelling, and purulent discharge over the past several days.  She states the pain has acutely worsened.  She denies any fevers.  She denies any other associated symptoms.  The history is provided by the patient. No language interpreter was used.       Past Medical History:  Diagnosis Date  . Scleroderma [facility])    Dr Norina Buzzard    Patient Active Problem List   Diagnosis Date Noted  . Primary osteoarthritis of left foot 07/07/2014  . Other and unspecified hyperlipidemia 09/24/2011  . STRESS REACTION, ACUTE 01/09/2010  . ARM PAIN 01/09/2010  . DYSHIDROTIC ECZEMA 08/10/2009  . SCLERODERMA 12/22/2008    Past Surgical History:  Procedure Laterality Date  . CERVICAL DISC SURGERY  2005     OB History   No obstetric history on file.     Family History  Problem Relation Age of Onset  . Heart attack Father        early 88's  . Breast cancer Mother   . Diabetes Maternal Grandmother     Social History   Tobacco Use  . Smoking status: Current Every Day Smoker    Packs/day: 0.50    Years: 30.00    Pack years: 15.00    Types: Cigarettes  . Smokeless tobacco: Never Used  Substance Use Topics  . Alcohol use: No  . Drug use: No    Home Medications Prior to Admission medications   Medication Sig Start Date End Date Taking? Authorizing Provider  gabapentin (NEURONTIN) 300 MG capsule Take 300 mg by mouth 3 (three) times daily. 07/04/20   Yes [provider]  ibuprofen (ADVIL) 200 MG tablet Take 800 mg by mouth every 6 (six) hours as needed for mild pain.   Yes [provider]  Multiple Vitamins-Minerals (CENTRUM SILVER 50+WOMEN PO) Take 1 tablet by mouth daily.   Yes [provider]    Allergies    Patient has no known allergies.  Review of Systems   Review of Systems  All other systems reviewed and are negative.   Physical Exam Updated Vital Signs BP 120/87   Pulse 65   Temp 99.2 F (37.3 C) (Oral)   Resp 18   SpO2 98%   Physical Exam Vitals and nursing note reviewed.  Constitutional:      General: She is not in acute distress.    Appearance: She is well-developed.  HENT:     Head: Normocephalic and atraumatic.  Eyes:     Conjunctiva/sclera: Conjunctivae normal.  Cardiovascular:     Rate and Rhythm: Normal rate and regular rhythm.     Heart sounds: No murmur heard.   Pulmonary:     Effort: Pulmonary effort is normal. No respiratory distress.     Breath sounds: Normal breath sounds.  Abdominal:     Palpations: Abdomen is soft.     Tenderness: There is no  abdominal tenderness.  Musculoskeletal:        General: Normal range of motion.     Cervical back: Neck supple.  Skin:    General: Skin is warm and dry.  Neurological:     Mental Status: She is alert and oriented to person, place, and time.  Psychiatric:        Mood and Affect: Mood normal.        Behavior: Behavior normal.         ED Results / Procedures / Treatments   Labs (all labs ordered are listed, but only abnormal results are displayed) Labs Reviewed  COMPREHENSIVE METABOLIC PANEL - Abnormal; Notable for the following components:      Result Value   Glucose, Bld 105 (*)    Albumin 3.2 (*)    All other components within normal limits  I-STAT BETA HCG BLOOD, ED ([facility], [facility], [facility] ONLY) - Abnormal; Notable for the following components:   I-stat hCG, quantitative 7.5 (*)    All other components within  normal limits  CBC WITH DIFFERENTIAL/PLATELET    EKG None  Radiology DG Foot Complete Left  Result Date: 07/07/2020 CLINICAL DATA:  Pain with discoloration distally EXAM: LEFT FOOT - COMPLETE 3+ VIEW COMPARISON:  July 06, 2014 FINDINGS: Frontal, oblique, and lateral views were obtained. Bones are diffusely osteoporotic. No acute fracture or dislocation. Joint spaces appear unremarkable. No erosive change or bony destruction. There are small posterior and inferior calcaneal spurs. There is a stable exostosis along the dorsal distal talus. Benign calcification is noted medial to the medial cuneiform bone distally, stable. IMPRESSION: Diffuse osteoporosis. No bony destruction or erosion. No fracture or dislocation. No appreciable joint space narrowing. Small calcaneal spurs noted. Electronically Signed   By: Bretta Bang III M.D.   On: 07/07/2020 18:34    Procedures Procedures   Medications Ordered in ED Medications  morphine 4 MG/ML injection 4 mg (has no administration in time range)    ED Course  I have reviewed the triage vital signs and the nursing notes.  Pertinent labs & imaging results that were available during my care of the patient were reviewed by me and considered in my medical decision making (see chart for details).    MDM Rules/Calculators/A&P                          This patient complains of worsening toe pain, this involves an extensive number of treatment options, and is a complaint that carries with it a high risk of complications and morbidity.    Differential Dx Infection, osteo, necrosis, gangrene, ischemia  Pertinent Labs I ordered, reviewed, and interpreted labs, which included CBC, CMP, which were reassuring.  Imaging Interpretation I ordered imaging studies which included left foot, which showed diffuse osteoporosis, no bony destruction or erosion..   Medications I ordered medication morphine and vanc for pain and  infection.  Sources  Previous records obtained and reviewed and show that she has most of her care at [facility], but thus far she states that her surgeons have refused to do anything aggressive.  This is thought to be due to the small vascular disease.  She told me that she wished they'd just cut it off.   Critical Interventions  none  Reassessments After the interventions stated above, I reevaluated the patient and found still uncomfortable.  Consultants Dr. Julian Reil, who is appreciated for admitting.  Plan Admit    Final Clinical Impression(s) /  ED Diagnoses Final diagnoses:  Toe infection    Rx / DC Orders ED Discharge Orders    None       Roxy Horseman, PA-C 07/07/20 2356    Tegeler, Canary Brim, MD 07/08/20 [phone]

## 2020-07-07 NOTE — ED Triage Notes (Signed)
Pt here with concern for gangrene to several toes on her L foot. Pt states she has been battling the wounds for almost a month.

## 2020-07-07 NOTE — Progress Notes (Signed)
Pharmacy Antibiotic Note  Kristen Fletcher is a 55 y.o. female admitted on 07/07/2020 presenting with foot infection.  Pharmacy has been consulted for vancomycin dosing.  Plan: Vancomycin 1750 mg IV x 1, then 750 mg IV every 12 hours (eAUC 446, Goal AUC 400-550, SCr 0.85) Monitor renal function, Cx and clinical progression to narrow Vancomycin levels as needed     Temp (24hrs), Avg:99.2 F (37.3 C), Min:99.2 F (37.3 C), Max:99.2 F (37.3 C)  Recent Labs  Lab 07/07/20 1812  WBC 8.9  CREATININE 0.85    CrCl cannot be calculated (Unknown ideal weight.).    No Known Allergies  Daylene Posey, PharmD Clinical Pharmacist ED Pharmacist Phone # [phone] 07/07/2020 10:22 PM

## 2020-07-08 ENCOUNTER — Encounter (HOSPITAL_COMMUNITY): Payer: Self-pay | Admitting: Internal Medicine

## 2020-07-08 DIAGNOSIS — M349 Systemic sclerosis, unspecified: Secondary | ICD-10-CM

## 2020-07-08 DIAGNOSIS — Z6841 Body Mass Index (BMI) 40.0 and over, adult: Secondary | ICD-10-CM | POA: Diagnosis not present

## 2020-07-08 DIAGNOSIS — M3483 Systemic sclerosis with polyneuropathy: Secondary | ICD-10-CM | POA: Diagnosis present

## 2020-07-08 DIAGNOSIS — Z833 Family history of diabetes mellitus: Secondary | ICD-10-CM | POA: Diagnosis not present

## 2020-07-08 DIAGNOSIS — Z8249 Family history of ischemic heart disease and other diseases of the circulatory system: Secondary | ICD-10-CM | POA: Diagnosis not present

## 2020-07-08 DIAGNOSIS — I7301 Raynaud's syndrome with gangrene: Secondary | ICD-10-CM | POA: Diagnosis present

## 2020-07-08 DIAGNOSIS — I96 Gangrene, not elsewhere classified: Secondary | ICD-10-CM

## 2020-07-08 DIAGNOSIS — Z79899 Other long term (current) drug therapy: Secondary | ICD-10-CM | POA: Diagnosis not present

## 2020-07-08 DIAGNOSIS — F17210 Nicotine dependence, cigarettes, uncomplicated: Secondary | ICD-10-CM | POA: Diagnosis present

## 2020-07-08 DIAGNOSIS — L089 Local infection of the skin and subcutaneous tissue, unspecified: Secondary | ICD-10-CM | POA: Diagnosis not present

## 2020-07-08 DIAGNOSIS — Z803 Family history of malignant neoplasm of breast: Secondary | ICD-10-CM | POA: Diagnosis not present

## 2020-07-08 DIAGNOSIS — Z20822 Contact with and (suspected) exposure to covid-19: Secondary | ICD-10-CM | POA: Diagnosis present

## 2020-07-08 LAB — HEMOGLOBIN A1C
Hgb A1c MFr Bld: 5.8 % — ABNORMAL HIGH (ref 4.8–5.6)
Mean Plasma Glucose: 119.76 mg/dL

## 2020-07-08 LAB — PREALBUMIN: Prealbumin: 15.3 mg/dL — ABNORMAL LOW (ref 18–38)

## 2020-07-08 LAB — C-REACTIVE PROTEIN: CRP: 0.8 mg/dL (ref <1.0)

## 2020-07-08 LAB — SARS CORONAVIRUS 2 (TAT 6-24 HRS): SARS Coronavirus 2: NEGATIVE

## 2020-07-08 LAB — HIV ANTIBODY (ROUTINE TESTING W REFLEX): HIV Screen 4th Generation wRfx: NONREACTIVE

## 2020-07-08 LAB — SEDIMENTATION RATE: Sed Rate: 22 mm/hr (ref 0–22)

## 2020-07-08 MED ORDER — ONDANSETRON HCL 4 MG PO TABS: 4.0000 mg | ORAL_TABLET | Freq: Four times a day (QID) | ORAL | Status: DC | PRN

## 2020-07-08 MED ORDER — LACTATED RINGERS IV SOLN: INTRAVENOUS | Status: AC

## 2020-07-08 MED ORDER — ACETAMINOPHEN 325 MG PO TABS
650.0000 mg | ORAL_TABLET | Freq: Four times a day (QID) | ORAL | Status: DC | PRN
  Administered 2020-07-08 – 2020-07-09 (×2): 650 mg via ORAL
  Filled 2020-07-08 (×2): qty 2

## 2020-07-08 MED ORDER — JUVEN PO PACK
1.0000 | PACK | Freq: Two times a day (BID) | ORAL | Status: DC
  Administered 2020-07-08 – 2020-07-11 (×6): 1 via ORAL
  Filled 2020-07-08 (×6): qty 1

## 2020-07-08 MED ORDER — MORPHINE SULFATE (PF) 2 MG/ML IV SOLN
2.0000 mg | INTRAVENOUS | Status: DC | PRN
  Administered 2020-07-08 – 2020-07-09 (×2): 2 mg via INTRAVENOUS
  Filled 2020-07-08 (×2): qty 1

## 2020-07-08 MED ORDER — ONDANSETRON HCL 4 MG/2ML IJ SOLN: 4.0000 mg | Freq: Four times a day (QID) | INTRAMUSCULAR | Status: DC | PRN

## 2020-07-08 MED ORDER — ADULT MULTIVITAMIN W/MINERALS CH
1.0000 | ORAL_TABLET | Freq: Every day | ORAL | Status: DC
  Administered 2020-07-08 – 2020-07-11 (×4): 1 via ORAL
  Filled 2020-07-08 (×4): qty 1

## 2020-07-08 MED ORDER — ENOXAPARIN SODIUM 40 MG/0.4ML ~~LOC~~ SOLN
40.0000 mg | SUBCUTANEOUS | Status: DC
  Administered 2020-07-08 – 2020-07-11 (×4): 40 mg via SUBCUTANEOUS
  Filled 2020-07-08 (×4): qty 0.4

## 2020-07-08 MED ORDER — PENTOXIFYLLINE ER 400 MG PO TBCR
400.0000 mg | EXTENDED_RELEASE_TABLET | Freq: Three times a day (TID) | ORAL | Status: DC
  Administered 2020-07-08 – 2020-07-11 (×8): 400 mg via ORAL
  Filled 2020-07-08 (×10): qty 1

## 2020-07-08 MED ORDER — ACETAMINOPHEN 650 MG RE SUPP: 650.0000 mg | Freq: Four times a day (QID) | RECTAL | Status: DC | PRN

## 2020-07-08 MED ORDER — SODIUM CHLORIDE 0.9 % IV BOLUS
500.0000 mL | Freq: Once | INTRAVENOUS | Status: AC
  Administered 2020-07-08: 500 mL via INTRAVENOUS

## 2020-07-08 MED ORDER — ASCORBIC ACID 500 MG PO TABS
500.0000 mg | ORAL_TABLET | Freq: Every day | ORAL | Status: DC
  Administered 2020-07-08 – 2020-07-11 (×4): 500 mg via ORAL
  Filled 2020-07-08 (×4): qty 1

## 2020-07-08 MED ORDER — NITROGLYCERIN 0.2 MG/HR TD PT24
0.2000 mg | MEDICATED_PATCH | Freq: Every day | TRANSDERMAL | Status: DC
  Administered 2020-07-08 – 2020-07-11 (×4): 0.2 mg via TRANSDERMAL
  Filled 2020-07-08 (×4): qty 1

## 2020-07-08 NOTE — Progress Notes (Signed)
Initial Nutrition Assessment  DOCUMENTATION CODES:   Not applicable  INTERVENTION:   Juven BID, each packet provides 80 calories, 8 grams of carbohydrate, 2.5  grams of protein (collagen), 7 grams of L-arginine and 7 grams of L-glutamine; supplement contains CaHMB, Vitamins C, E, B12 and Zinc to promote wound healing  MVI with Minerals  Add Vitamin C 500 mg daily  Need new weight  NUTRITION DIAGNOSIS:   Increased nutrient needs related to wound healing as evidenced by estimated needs.  GOAL:   Patient will meet greater than or equal to 90% of their needs  MONITOR:   PO intake,Supplement acceptance,Skin  REASON FOR ASSESSMENT:   Consult Wound healing  ASSESSMENT:   55 yo female admitted with ischemic gangrenous changes to left foot. Pt with hx of scleroderma, PAD, Reynauds   Pt evaluated by Dr.Duda today for possible change of limb salvage vs transmetatarsal amputation  Pt hx of tobacco abuse; pt reports she quit smoking about 1 month ago as every time she smoked it made her foot hurt. Praised pt for quitting and encouraged her to keep it up  Pt reports good appetite, eating well currently and PTA  Pt reports weight gain due to immobility/limited exercise due to foot wounds and eating poorly.  Pt is very motivated; she wants to get back to working and wants to become a "gym rat" No weight in chart since 2016, need new weight  Discussed importance of adequate nutrition, in particular adequate protein intake with regards to wound healing.   Given hx of tobacco abuse, plan to add Vitamin C supplement given increased possiblity of deficiency  Pt denies any GI issues related to scleroderma; denies any difficulty swallowing  Labs: reviewed Meds: reviewed    Diet Order:   Diet Order            Diet regular Room service appropriate? 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  Estimated Nutritional Needs:   Kcal:  1900-2100 kcals  Protein:  95-110 g  Fluid:  >/= 1.9 L    08/08/14 MS, RDN, LDN, CNSC Registered Dietitian III Clinical Nutrition RD Pager and On-Call Pager Number Located in [place]

## 2020-07-08 NOTE — Consult Note (Signed)
ORTHOPAEDIC CONSULTATION  REQUESTING PHYSICIAN: Maretta Bees, MD  Chief Complaint: Painful ischemic ulcers left toes  HPI: Kristen Fletcher is a 55 y.o. female who presents with scleroderma.  She has had ischemic gangrenous changes to the left foot that started with the little toe advanced to the fourth toe and now has ischemic ulcers to the great toe left foot.  Patient feels like the little toe has improved.  Past Medical History:  Diagnosis Date  . Scleroderma (HCC)    Dr Norina Buzzard   Past Surgical History:  Procedure Laterality Date  . CERVICAL DISC SURGERY  2005   Social History   Socioeconomic History  . Marital status: Divorced    Spouse name: Not on file  . Number of children: Not on file  . Years of education: Not on file  . Highest education level: Not on file  Occupational History  . Occupation: works form Soil scientist: [employer]  Tobacco Use  . Smoking status: Former Smoker    Packs/day: 0.50    Years: 30.00    Pack years: 15.00    Types: Cigarettes    Quit date: 04/2020    Years since quitting: 0.2  . Smokeless tobacco: Never Used  Substance and Sexual Activity  . Alcohol use: No  . Drug use: No  . Sexual activity: Not on file  Other Topics Concern  . Not on file  Social History Narrative  . Not on file   Social Determinants of Health   Financial Resource Strain: Not on file  Food Insecurity: Not on file  Transportation Needs: Not on file  Physical Activity: Not on file  Stress: Not on file  Social Connections: Not on file   Family History  Problem Relation Age of Onset  . Heart attack Father        early 37's  . Breast cancer Mother   . Diabetes Maternal Grandmother    - negative except otherwise stated in the family history section No Known Allergies Prior to Admission medications   Medication Sig Start Date End Date Taking? Authorizing Provider  gabapentin (NEURONTIN) 300 MG capsule Take 300 mg by mouth 3 (three)  times daily. 07/04/20  Yes [provider]  ibuprofen (ADVIL) 200 MG tablet Take 800 mg by mouth every 6 (six) hours as needed for mild pain.   Yes [provider]  Multiple Vitamins-Minerals (CENTRUM SILVER 50+WOMEN PO) Take 1 tablet by mouth daily.   Yes [provider]   DG Foot Complete Left  Result Date: 07/07/2020 CLINICAL DATA:  Pain with discoloration distally EXAM: LEFT FOOT - COMPLETE 3+ VIEW COMPARISON:  July 06, 2014 FINDINGS: Frontal, oblique, and lateral views were obtained. Bones are diffusely osteoporotic. No acute fracture or dislocation. Joint spaces appear unremarkable. No erosive change or bony destruction. There are small posterior and inferior calcaneal spurs. There is a stable exostosis along the dorsal distal talus. Benign calcification is noted medial to the medial cuneiform bone distally, stable. IMPRESSION: Diffuse osteoporosis. No bony destruction or erosion. No fracture or dislocation. No appreciable joint space narrowing. Small calcaneal spurs noted. Electronically Signed   By: Bretta Bang III M.D.   On: 07/07/2020 18:34   - pertinent xrays, CT, MRI studies were reviewed and independently interpreted  Positive ROS: All other systems have been reviewed and were otherwise negative with the exception of those mentioned in the HPI and as above.  Physical Exam: General: Alert, no acute distress Psychiatric: Patient  is competent for consent with normal mood and affect Lymphatic: No axillary or cervical lymphadenopathy Cardiovascular: No pedal edema Respiratory: No cyanosis, no use of accessory musculature GI: No organomegaly, abdomen is soft and non-tender    Images:  @ENCIMAGES @  Labs:  Lab Results  Component Value Date   HGBA1C 5.8 (H) 07/08/2020   ESRSEDRATE 22 07/08/2020   CRP 0.8 07/08/2020    Lab Results  Component Value Date   ALBUMIN 3.2 (L) 07/07/2020   ALBUMIN 4.1 08/04/2014   ALBUMIN 4.2 07/06/2014    PREALBUMIN 15.3 (L) 07/08/2020    No results found for: CBC  Neurologic: Patient does not have protective sensation bilateral lower extremities.   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  She has a strong dorsalis pedis and posterior tibial pulse on the left and right foot.  There are no ulcers on the right foot.  Left foot shows dry gangrenous changes to the little toe fourth toe and ischemic ulcers on the plantar aspect of the great toe.  Patient's foot is extremely tender to light touch.  There is no cellulitis.  Review of the MRI scan of the left foot does not show any changes of osteomyelitis.  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  Plan: Plan: I will try a course of a topical nitroglycerin patch to the left foot dorsalis pedis artery, change daily, and Trental 3 times a day.  I will reevaluate Saturday morning.  I feel patient does have a chance of limb salvage if we can slightly improve her microcirculation with medication.  Discussed that if patient failed limb salvage she would require a transmetatarsal amputation.  Thank you for the consult and the opportunity to see Kristen Fletcher  Friday, MD [facility] [phone] 1:01 PM

## 2020-07-08 NOTE — H&P (Signed)
History and Physical    Kristen Fletcher SEG:[id number] DOB: 1965-08-17 DOA: 07/07/2020  PCP: Pcp, No  Patient coming from: Home  I have personally briefly reviewed patient's old medical records in [facility]  Chief Complaint: Toe Gangrene  HPI: Kristen Fletcher is a 55 y.o. female with medical history significant of Scleroderma.  Raynaud's began in 2007-08 but hasnt had significant symptoms until 2021.  In May: pt developed ulcers on the R foot, she then developed heal ulcerations and gangrene. Right foot improved dramatically and gangrene/ulcerations healed completely. Left foot is currently the biggest concern.  She has gangrene of 4th and 5th digit, and now recently developed gangrene of great toe as well.  Pain is constant, severe, symptoms are worsening.  Saw Vascular surgery with [facility]: no large vessel disease / occlusion, just small vessel dz.  Pt also sees podiatry with [facility].  Pain has become too severe and she now presents to ED for possible amputations due to severity of pain as well as progression of gangrene to involve the great toe.  No fevers, chills.  Does have purulent discharge from great toe onset over past few days.  Acute worsening of pain.   ED Course: Pt started on IV vanc.  Hospitalist asked to admit.   Review of Systems: As per HPI, otherwise all review of systems negative.  Past Medical History:  Diagnosis Date  . Scleroderma (HCC)    Dr Norina Buzzard    Past Surgical History:  Procedure Laterality Date  . CERVICAL DISC SURGERY  2005     reports that she has been smoking cigarettes. She has a 15.00 pack-year smoking history. She has never used smokeless tobacco. She reports that she does not drink alcohol and does not use drugs.  No Known Allergies  Family History  Problem Relation Age of Onset  . Heart attack Father        early 19's  . Breast cancer Mother   . Diabetes Maternal Grandmother      Prior to Admission medications   Medication  Sig Start Date End Date Taking? Authorizing Provider  gabapentin (NEURONTIN) 300 MG capsule Take 300 mg by mouth 3 (three) times daily. 07/04/20  Yes [provider]  ibuprofen (ADVIL) 200 MG tablet Take 800 mg by mouth every 6 (six) hours as needed for mild pain.   Yes [provider]  Multiple Vitamins-Minerals (CENTRUM SILVER 50+WOMEN PO) Take 1 tablet by mouth daily.   Yes [provider]    Physical Exam: Vitals:   07/07/20 2323 07/07/20 2344 07/07/20 2345 07/08/20 0000  BP: 115/70  (!) 143/63 126/62  Pulse: 60 63 62 64  Resp: 18 18 15 10   Temp:      TempSrc:      SpO2: 97% 100% 100% 97%    Constitutional: NAD, calm, comfortable Eyes: PERRL, lids and conjunctivae normal ENMT: Mucous membranes are moist. Posterior pharynx clear of any exudate or lesions.Normal dentition.  Neck: normal, supple, no masses, no thyromegaly Respiratory: clear to auscultation bilaterally, no wheezing, no crackles. Normal respiratory effort. No accessory muscle use.  Cardiovascular: Regular rate and rhythm, no murmurs / rubs / gallops. No extremity edema. 2+ pedal pulses. No carotid bruits.  Abdomen: no tenderness, no masses palpated. No hepatosplenomegaly. Bowel sounds positive.  Musculoskeletal: no clubbing / cyanosis. No joint deformity upper and lower extremities. Good ROM, no contractures. Normal muscle tone.  Skin:    Neurologic: CN 2-12 grossly intact. Sensation intact, DTR normal. Strength 5/5 in  all 4.  Psychiatric: Normal judgment and insight. Alert and oriented x 3. Normal mood.    Labs on Admission: I have personally reviewed following labs and imaging studies  CBC: Recent Labs  Lab 07/07/20 1812  WBC 8.9  NEUTROABS 6.0  HGB 14.1  HCT 43.7  MCV 88.6  PLT 272   Basic Metabolic Panel: Recent Labs  Lab 07/07/20 1812  NA 138  K 4.0  CL 109  CO2 22  GLUCOSE 105*  BUN 14  CREATININE 0.85  CALCIUM 9.0   GFR: CrCl cannot be calculated (Unknown  ideal weight.). Liver Function Tests: Recent Labs  Lab 07/07/20 1812  AST 26  ALT 27  ALKPHOS 71  BILITOT 0.6  PROT 6.7  ALBUMIN 3.2*   No results for input(s): LIPASE, AMYLASE in the last 168 hours. No results for input(s): AMMONIA in the last 168 hours. Coagulation Profile: No results for input(s): INR, PROTIME in the last 168 hours. Cardiac Enzymes: No results for input(s): CKTOTAL, CKMB, CKMBINDEX, TROPONINI in the last 168 hours. BNP (last 3 results) No results for input(s): PROBNP in the last 8760 hours. HbA1C: No results for input(s): HGBA1C in the last 72 hours. CBG: No results for input(s): GLUCAP in the last 168 hours. Lipid Profile: No results for input(s): CHOL, HDL, LDLCALC, TRIG, CHOLHDL, LDLDIRECT in the last 72 hours. Thyroid Function Tests: No results for input(s): TSH, T4TOTAL, FREET4, T3FREE, THYROIDAB in the last 72 hours. Anemia Panel: No results for input(s): VITAMINB12, FOLATE, FERRITIN, TIBC, IRON, RETICCTPCT in the last 72 hours. Urine analysis:    Component Value Date/Time   COLORURINE YELLOW 09/22/2013 0806   APPEARANCEUR CLEAR 09/22/2013 0806   LABSPEC 1.024 09/22/2013 0806   PHURINE 5.0 09/22/2013 0806   GLUCOSEU NEG 09/22/2013 0806   HGBUR NEG 09/22/2013 0806   BILIRUBINUR NEG 09/22/2013 0806   KETONESUR NEG 09/22/2013 0806   PROTEINUR NEG 09/22/2013 0806   UROBILINOGEN 0.2 09/22/2013 0806   NITRITE NEG 09/22/2013 0806   LEUKOCYTESUR NEG 09/22/2013 0806    Radiological Exams on Admission: DG Foot Complete Left  Result Date: 07/07/2020 CLINICAL DATA:  Pain with discoloration distally EXAM: LEFT FOOT - COMPLETE 3+ VIEW COMPARISON:  July 06, 2014 FINDINGS: Frontal, oblique, and lateral views were obtained. Bones are diffusely osteoporotic. No acute fracture or dislocation. Joint spaces appear unremarkable. No erosive change or bony destruction. There are small posterior and inferior calcaneal spurs. There is a stable exostosis along the  dorsal distal talus. Benign calcification is noted medial to the medial cuneiform bone distally, stable. IMPRESSION: Diffuse osteoporosis. No bony destruction or erosion. No fracture or dislocation. No appreciable joint space narrowing. Small calcaneal spurs noted. Electronically Signed   By: Bretta Bang III M.D.   On: 07/07/2020 18:34    EKG: Independently reviewed.  Assessment/Plan Principal Problem:   Gangrene of toe (HCC) Active Problems:   SCLERODERMA    1. Gangrene of 1st, 4th, and 5th toes of L foot - 1. Pt has dry gangrene of 4th and 5 toes, but wet gangrene with purulent exudate of first toe concerning for infection. 2. X ray showed no bony destruction. 3. Cont vanc for the moment 4. Will obtain wound culture 5. Morphine PRN pain 6. Cont gabapentin 7. Consult ortho surgery in AM 2. Scleroderma - 1. Not on immunosuppressives and rheum didn't recommend any at this time (see note from earlier this month in Care everywhere). 2. ? Anything that can be done surgically about the raynaud? 3.  BP in ED is running between 108 to 143 systolic.  Dont know that we can start pt on CCB therefore...  DVT prophylaxis: Lovenox Code Status: Full Family Communication: No family in room Disposition Plan: Home after treatment of gangrene of toes Consults called: None Admission status: Place in 57    Kristen Fletcher M. DO [facility]  How to contact the [facility] Attending or Consulting provider 7A - 7P or covering provider during after hours 7P -7A, for this patient?  1. Check the care team in [facility] and look for a) attending/consulting [facility] provider listed and b) the [facility] team listed 2. Log into [email or web address]  Amion Physician Scheduling and messaging for groups and whole hospitals  On call and physician scheduling software for group practices, residents, hospitalists and other medical providers for call, clinic, rotation and shift schedules. On[employer] is a hospital-wide system  for scheduling doctors and paging doctors on call. EasyPlot is for scientific plotting and data analysis.  [email or web address]  and use [place]'s universal password to access. If you do not have the password, please contact the hospital operator.  3. Locate the [facility] provider you are looking for under [facility] and page to a number that you can be directly reached. 4. If you still have difficulty reaching the provider, please page the [facility] (Director on Call) for the Hospitalists listed on amion for assistance.  07/08/2020, 12:12 AM

## 2020-07-08 NOTE — Progress Notes (Signed)
PROGRESS NOTE        PATIENT DETAILS Name: Kristen Fletcher Age: 55 y.o. Sex: female Date of Birth: 10-02-1965 Admit Date: 07/07/2020 Admitting Physician Hillary Bow, DO PCP:Pcp, No  Brief Narrative: Patient is a 55 y.o. female with history of scleroderma, Raynaud's syndrome-PAD-presenting with left fourth, fifth digit gangrene and pain.  Subsequently admitted to the hospitalist service.  See below for further details.  Significant events: 3/31>> severe pain-gangrenous fourth and fifth left digit of her left foot-admit to [facility].  Significant studies: 3/31>> x-ray left foot: No fracture dislocation.  Antimicrobial therapy: Vancomycin: 3/31>>  Microbiology data: 3/31>> blood culture: Pending  Procedures : None  Consults: Orthopedics  DVT Prophylaxis : enoxaparin (LOVENOX) injection 40 mg Start: 07/08/20 1000    Subjective: Continues to complain of severe pain in the left fourth and fifth digits of her foot.   Assessment/Plan: Dry gangrene involving left fourth and fifth toes: Continues to have severe pain-on IV morphine as needed-have consulted Dr. Crista Curb further recommendations.  Continue IV vancomycin for now-to cover superimposed soft tissue infection.  Reviewed records in care everywhere-she has been evaluated by vascular surgery in the past at [facility] deemed to have small vessel disease not amenable to intervention.  Neuropathic pain: Continue Neurontin-on as needed morphine.  History of scleroderma/Raynaud's phenomena  Diet: Diet Order            Diet regular Room service appropriate? Yes; Fluid consistency: Thin  Diet effective now                  Code Status: Full code   Family Communication: None at bedside  Disposition Plan: Status is: Observation  The patient will require care spanning > 2 midnights and should be moved to inpatient because: Inpatient level of care appropriate due to  severity of illness  Dispo: The patient is from: Home              Anticipated d/c is to: Home              Patient currently is not medically stable to d/c.   Difficult to place patient No    Barriers to Discharge: Severe intractable neuropathic pain-left toe fourth and fifth gangrene-with possible superinfection-on IV vancomycin.  Awaiting orthopedic opinion regarding amputation.  Antimicrobial agents: Anti-infectives (From admission, onward)   Start     Dose/Rate Route Frequency Ordered Stop   07/08/20 1100  vancomycin (VANCOREADY) IVPB 750 mg/150 mL        750 mg 150 mL/hr over 60 Minutes Intravenous Every 12 hours 07/07/20 2226     07/07/20 2230  vancomycin (VANCOCIN) 1,750 mg in sodium chloride 0.9 % 500 mL IVPB        1,750 mg 250 mL/hr over 120 Minutes Intravenous  Once 07/07/20 2226 07/08/20 0207       Time spent: 25- minutes-Greater than 50% of this time was spent in counseling, explanation of diagnosis, planning of further management, and coordination of care.  MEDICATIONS: Scheduled Meds: . enoxaparin (LOVENOX) injection  40 mg Subcutaneous Q24H  . gabapentin  300 mg Oral TID   Continuous Infusions: . lactated ringers 125 mL/hr at 07/08/20 0655  . vancomycin 750 mg (07/08/20 1040)   PRN Meds:.acetaminophen **OR** acetaminophen, ibuprofen, morphine injection, ondansetron **OR** ondansetron (ZOFRAN) IV   PHYSICAL EXAM: Vital signs: Vitals:  07/08/20 0612 07/08/20 0615 07/08/20 0633 07/08/20 0648  BP: (!) 92/47 (!) 91/52  (!) 121/57  Pulse:  (!) 58  62  Resp:  10  18  Temp:   98.1 F (36.7 C) 98 F (36.7 C)  TempSrc:   Oral Oral  SpO2:  93%  97%   There were no vitals filed for this visit. There is no height or weight on file to calculate BMI.   Gen Exam:Alert awake-not in any distress HEENT:atraumatic, normocephalic Chest: B/L clear to auscultation anteriorly CVS:S1S2 regular Abdomen:soft non tender, non distended Extremities:no edema.  Left  fourth/fifth toe-gangrenous-some gangrene developing in the dorsal aspect of great toe as well Neurology: Non focal Skin: no rash  I have personally reviewed following labs and imaging studies  LABORATORY DATA: CBC: Recent Labs  Lab 07/07/20 1812  WBC 8.9  NEUTROABS 6.0  HGB 14.1  HCT 43.7  MCV 88.6  PLT 272    Basic Metabolic Panel: Recent Labs  Lab 07/07/20 1812  NA 138  K 4.0  CL 109  CO2 22  GLUCOSE 105*  BUN 14  CREATININE 0.85  CALCIUM 9.0    GFR: CrCl cannot be calculated (Unknown ideal weight.).  Liver Function Tests: Recent Labs  Lab 07/07/20 1812  AST 26  ALT 27  ALKPHOS 71  BILITOT 0.6  PROT 6.7  ALBUMIN 3.2*   No results for input(s): LIPASE, AMYLASE in the last 168 hours. No results for input(s): AMMONIA in the last 168 hours.  Coagulation Profile: No results for input(s): INR, PROTIME in the last 168 hours.  Cardiac Enzymes: No results for input(s): CKTOTAL, CKMB, CKMBINDEX, TROPONINI in the last 168 hours.  BNP (last 3 results) No results for input(s): PROBNP in the last 8760 hours.  Lipid Profile: No results for input(s): CHOL, HDL, LDLCALC, TRIG, CHOLHDL, LDLDIRECT in the last 72 hours.  Thyroid Function Tests: No results for input(s): TSH, T4TOTAL, FREET4, T3FREE, THYROIDAB in the last 72 hours.  Anemia Panel: No results for input(s): VITAMINB12, FOLATE, FERRITIN, TIBC, IRON, RETICCTPCT in the last 72 hours.  Urine analysis:    Component Value Date/Time   COLORURINE YELLOW 09/22/2013 0806   APPEARANCEUR CLEAR 09/22/2013 0806   LABSPEC 1.024 09/22/2013 0806   PHURINE 5.0 09/22/2013 0806   GLUCOSEU NEG 09/22/2013 0806   HGBUR NEG 09/22/2013 0806   BILIRUBINUR NEG 09/22/2013 0806   KETONESUR NEG 09/22/2013 0806   PROTEINUR NEG 09/22/2013 0806   UROBILINOGEN 0.2 09/22/2013 0806   NITRITE NEG 09/22/2013 0806   LEUKOCYTESUR NEG 09/22/2013 0806    Sepsis Labs: Lactic Acid, Venous No results found for:  LATICACIDVEN  MICROBIOLOGY: No results found for this or any previous visit (from the past 240 hour(s)).  RADIOLOGY STUDIES/RESULTS: DG Foot Complete Left  Result Date: 07/07/2020 CLINICAL DATA:  Pain with discoloration distally EXAM: LEFT FOOT - COMPLETE 3+ VIEW COMPARISON:  July 06, 2014 FINDINGS: Frontal, oblique, and lateral views were obtained. Bones are diffusely osteoporotic. No acute fracture or dislocation. Joint spaces appear unremarkable. No erosive change or bony destruction. There are small posterior and inferior calcaneal spurs. There is a stable exostosis along the dorsal distal talus. Benign calcification is noted medial to the medial cuneiform bone distally, stable. IMPRESSION: Diffuse osteoporosis. No bony destruction or erosion. No fracture or dislocation. No appreciable joint space narrowing. Small calcaneal spurs noted. Electronically Signed   By: Bretta Bang III M.D.   On: 07/07/2020 18:34     LOS: 0 days  Jeoffrey Massed, MD  [facility]    To contact the attending provider between 7A-7P or the covering provider during after hours 7P-7A, please log into the web site [email or web address] and access using universal [place] password for that web site. If you do not have the password, please call the hospital operator.  07/08/2020, 11:45 AM

## 2020-07-08 NOTE — Progress Notes (Signed)
Received patient from ED, AOx4, pain at 2/10 at left toes, VSS, oriented to room, bed control, call light and plan of care.  Will endorse to day shift RN appropriately.

## 2020-07-08 NOTE — Progress Notes (Signed)
[facility] night shift.  The nursing staff reports that the patient has had recent soft blood pressures with systolic BP measurements in the 90s and low 100s and diastolic below 60 mmHg.  A 500 mL bolus and NS infusion at 100 mL/h x 10-hours has been ordered.  Sanda Klein, MD.

## 2020-07-08 NOTE — ED Notes (Signed)
Floor coverage paged to notified MD that pts diastolic pressures have been below 60 (per order) - Pt has no complaints of pain, CP, or SOB.

## 2020-07-09 DIAGNOSIS — L089 Local infection of the skin and subcutaneous tissue, unspecified: Secondary | ICD-10-CM

## 2020-07-09 NOTE — Progress Notes (Signed)
Patient ID: Kristen Fletcher, female   DOB: 03/23/1966, 55 y.o.   MRN: [id number] Patient is seen in follow-up for the dry ischemic changes to toes 4 and 5 and the plantar aspect of her great toe left foot.  Patient was started on nitroglycerin patch and Trental yesterday.  The wounds appear stable the great toe shows improvement.  She is still extremely tender to light touch.  Will continue with current treatment and I will reevaluate on Monday.

## 2020-07-09 NOTE — Progress Notes (Addendum)
PROGRESS NOTE        PATIENT DETAILS Name: Kristen BeechamCrystal Fletcher Age: 55 y.o. Sex: female Date of Birth: 1966-03-18 Admit Date: 07/07/2020 Admitting Physician Hillary BowJared M Gardner, DO PCP:Pcp, No  Brief Narrative: Patient is a 55 y.o. female with history of scleroderma, Raynaud's syndrome-PAD-presenting with left fourth, fifth digit gangrene and pain.  Subsequently admitted to the hospitalist service.  See below for further details.  Significant events: 3/31>> severe pain-gangrenous fourth and fifth left digit of her left foot-admit to [facility].  Significant studies: 3/31>> x-ray left foot: No fracture dislocation.  Antimicrobial therapy: Vancomycin: 3/31>>  Microbiology data: 3/31>> blood culture: No growth.  Procedures : None  Consults: Orthopedics  DVT Prophylaxis : enoxaparin (LOVENOX) injection 40 mg Start: 07/08/20 1000   Subjective: Continues to have pain in her left foot.  She is concerned about the plantar aspect of her great toe-where she thinks she is having a little bit of discharge.  Thinks her pain is not much better after starting nitroglycerin.  Assessment/Plan: Dry gangrene involving left fourth and fifth toes: Continues to have significant pain in the left foot-not much of improvement after starting nitroglycerin.  Remains on IV vancomycin to cover for soft tissue infection-especially around the great toe.  Reevaluated by Dr. Lajoyce Cornersuda today-he will again evaluate on Monday to see if patient will benefit from a transmetatarsal amputation.  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  Neuropathic pain: Continue Neurontin-on as needed morphine.  History of scleroderma/Raynaud's phenomena  Diet: Diet Order            Diet regular Room service appropriate? Yes; Fluid consistency: Thin  Diet effective  now                  Code Status: Full code   Family Communication: None at bedside  Disposition Plan: Status is: Observation  The patient will require care spanning > 2 midnights and should be moved to inpatient because: Inpatient level of care appropriate due to severity of illness  Dispo: The patient is from: Home              Anticipated d/c is to: Home              Patient currently is not medically stable to d/c.   Difficult to place patient No    Barriers to Discharge: Severe intractable neuropathic pain-left toe fourth and fifth gangrene-with possible superinfection-on IV vancomycin.  Orthopedics will reevaluate on Monday to see if patient would benefit from TMA.  Antimicrobial agents: Anti-infectives (From admission, onward)   Start     Dose/Rate Route Frequency Ordered Stop   07/08/20 1100  vancomycin (VANCOREADY) IVPB 750 mg/150 mL        750 mg 150 mL/hr over 60 Minutes Intravenous Every 12 hours 07/07/20 2226     07/07/20 2230  vancomycin (VANCOCIN) 1,750 mg in sodium chloride 0.9 % 500 mL IVPB        1,750 mg 250 mL/hr over 120 Minutes Intravenous  Once 07/07/20 2226 07/08/20 0207       Time spent: 25- minutes-Greater than 50% of this time was spent in counseling, explanation of diagnosis, planning of further management, and coordination of care.  MEDICATIONS: Scheduled Meds: .  vitamin C  500 mg Oral Daily  . enoxaparin (LOVENOX) injection  40 mg Subcutaneous Q24H  . gabapentin  300 mg Oral TID  . multivitamin with minerals  1 tablet Oral Daily  . nitroGLYCERIN  0.2 mg Transdermal Daily  . nutrition supplement (JUVEN)  1 packet Oral BID BM  . pentoxifylline  400 mg Oral TID WC   Continuous Infusions: . vancomycin 750 mg (07/09/20 1118)   PRN Meds:.acetaminophen **OR** acetaminophen, ibuprofen, morphine injection, ondansetron **OR** ondansetron (ZOFRAN) IV   PHYSICAL EXAM: Vital signs: Vitals:   07/08/20 1423 07/08/20 1455 07/08/20 2115  07/09/20 0525  BP: (!) 99/58 133/67 130/61 (!) 125/58  Pulse: 93 72 (!) 56 60  Resp: 18 18 17 18   Temp: 98.1 F (36.7 C) 98.3 F (36.8 C) 98 F (36.7 C) 97.7 F (36.5 C)  TempSrc: Oral Oral Oral Oral  SpO2: 95% 99% 100% 99%  Weight:      Height:       Filed Weights   07/08/20 1400  Weight: 125.6 kg   Body mass index is 43.37 kg/m.   Gen Exam:Alert awake-not in any distress HEENT:atraumatic, normocephalic Chest: B/L clear to auscultation anteriorly CVS:S1S2 regular Abdomen:soft non tender, non distended Extremities:no edema.  Diagram Fourth and fifth toe of left foot-left great toe-plantar aspect-break in skin with some minimal discharge.  Some developing gangrenous changes in the plantar aspect of the great toe as well. Neurology: Non focal Skin: no rash  I have personally reviewed following labs and imaging studies  LABORATORY DATA: CBC: Recent Labs  Lab 07/07/20 1812  WBC 8.9  NEUTROABS 6.0  HGB 14.1  HCT 43.7  MCV 88.6  PLT 272    Basic Metabolic Panel: Recent Labs  Lab 07/07/20 1812  NA 138  K 4.0  CL 109  CO2 22  GLUCOSE 105*  BUN 14  CREATININE 0.85  CALCIUM 9.0    GFR: Estimated Creatinine Clearance: 101.7 mL/min (by C-G formula based on SCr of 0.85 mg/dL).  Liver Function Tests: Recent Labs  Lab 07/07/20 1812  AST 26  ALT 27  ALKPHOS 71  BILITOT 0.6  PROT 6.7  ALBUMIN 3.2*   No results for input(s): LIPASE, AMYLASE in the last 168 hours. No results for input(s): AMMONIA in the last 168 hours.  Coagulation Profile: No results for input(s): INR, PROTIME in the last 168 hours.  Cardiac Enzymes: No results for input(s): CKTOTAL, CKMB, CKMBINDEX, TROPONINI in the last 168 hours.  BNP (last 3 results) No results for input(s): PROBNP in the last 8760 hours.  Lipid Profile: No results for input(s): CHOL, HDL, LDLCALC, TRIG, CHOLHDL, LDLDIRECT in the last 72 hours.  Thyroid Function Tests: No results for input(s): TSH, T4TOTAL,  FREET4, T3FREE, THYROIDAB in the last 72 hours.  Anemia Panel: No results for input(s): VITAMINB12, FOLATE, FERRITIN, TIBC, IRON, RETICCTPCT in the last 72 hours.  Urine analysis:    Component Value Date/Time   COLORURINE YELLOW 09/22/2013 0806   APPEARANCEUR CLEAR 09/22/2013 0806   LABSPEC 1.024 09/22/2013 0806   PHURINE 5.0 09/22/2013 0806   GLUCOSEU NEG 09/22/2013 0806   HGBUR NEG 09/22/2013 0806   BILIRUBINUR NEG 09/22/2013 0806   KETONESUR NEG 09/22/2013 0806   PROTEINUR NEG 09/22/2013 0806   UROBILINOGEN 0.2 09/22/2013 0806   NITRITE NEG 09/22/2013 0806   LEUKOCYTESUR NEG 09/22/2013 0806    Sepsis Labs: Lactic Acid, Venous No results found for: LATICACIDVEN  MICROBIOLOGY: Recent Results (from the past 240 hour(s))  Blood  culture (routine x 2)     Status: None (Preliminary result)   Collection Time: 07/07/20 11:00 PM   Specimen: BLOOD RIGHT HAND  Result Value Ref Range Status   Specimen Description BLOOD RIGHT HAND  Final   Special Requests   Final    BOTTLES DRAWN AEROBIC AND ANAEROBIC Blood Culture adequate volume   Culture   Final    NO GROWTH 1 DAY Performed at [facility], [address], [place], [place] [address]    Report Status PENDING  Incomplete  Blood culture (routine x 2)     Status: None (Preliminary result)   Collection Time: 07/07/20 11:31 PM   Specimen: BLOOD LEFT HAND  Result Value Ref Range Status   Specimen Description BLOOD LEFT HAND  Final   Special Requests   Final    BOTTLES DRAWN AEROBIC AND ANAEROBIC Blood Culture results may not be optimal due to an inadequate volume of blood received in culture bottles   Culture   Final    NO GROWTH 1 DAY Performed at [facility], [address] [place], [place] [address]    Report Status PENDING  Incomplete  SARS CORONAVIRUS 2 (TAT 6-24 HRS)     Status: None   Collection Time: 07/08/20 12:08 AM  Result Value Ref Range Status   SARS Coronavirus 2 NEGATIVE NEGATIVE Final     Comment: (NOTE) SARS-CoV-2 target nucleic acids are NOT DETECTED.  The SARS-CoV-2 RNA is generally detectable in upper and lower respiratory specimens during the acute phase of infection. Negative results do not preclude SARS-CoV-2 infection, do not rule out co-infections with other pathogens, and should not be used as the sole basis for treatment or other patient management decisions. Negative results must be combined with clinical observations, patient history, and epidemiological information. The expected result is Negative.  Fact Sheet for Patients: HairSlick.no  Fact Sheet for Healthcare Providers: quierodirigir.com  This test is not yet approved or cleared by the [place] FDA and  has been authorized for detection and/or diagnosis of SARS-CoV-2 by FDA under an Emergency Use Authorization (EUA). This EUA will remain  in effect (meaning this test can be used) for the duration of the COVID-19 declaration under Se ction 564(b)(1) of the Act, 21 U.S.C. section 360bbb-3(b)(1), unless the authorization is terminated or revoked sooner.  Performed at [facility], [address], [place], [place] [address]     RADIOLOGY STUDIES/RESULTS: DG Foot Complete Left  Result Date: 07/07/2020 CLINICAL DATA:  Pain with discoloration distally EXAM: LEFT FOOT - COMPLETE 3+ VIEW COMPARISON:  July 06, 2014 FINDINGS: Frontal, oblique, and lateral views were obtained. Bones are diffusely osteoporotic. No acute fracture or dislocation. Joint spaces appear unremarkable. No erosive change or bony destruction. There are small posterior and inferior calcaneal spurs. There is a stable exostosis along the dorsal distal talus. Benign calcification is noted medial to the medial cuneiform bone distally, stable. IMPRESSION: Diffuse osteoporosis. No bony destruction or erosion. No fracture or dislocation. No appreciable joint space narrowing. Small  calcaneal spurs noted. Electronically Signed   By: Bretta Bang III M.D.   On: 07/07/2020 18:34     LOS: 1 day   Jeoffrey Massed, MD  [facility]    To contact the attending provider between 7A-7P or the covering provider during after hours 7P-7A, please log into the web site [email or web address] and access using universal [place] password for that web site. If you do not have the  password, please call the hospital operator.  07/09/2020, 2:30 PM

## 2020-07-10 LAB — BASIC METABOLIC PANEL
Anion gap: 8 (ref 5–15)
BUN: 20 mg/dL (ref 6–20)
CO2: 23 mmol/L (ref 22–32)
Calcium: 9 mg/dL (ref 8.9–10.3)
Chloride: 106 mmol/L (ref 98–111)
Creatinine, Ser: 0.83 mg/dL (ref 0.44–1.00)
GFR, Estimated: >60 mL/min (ref >60)
Glucose, Bld: 79 mg/dL (ref 70–99)
Potassium: 3.9 mmol/L (ref 3.5–5.1)
Sodium: 137 mmol/L (ref 135–145)

## 2020-07-10 LAB — CBC
HCT: 38.1 % (ref 36.0–46.0)
Hemoglobin: 12.5 g/dL (ref 12.0–15.0)
MCH: 28.7 pg (ref 26.0–34.0)
MCHC: 32.8 g/dL (ref 30.0–36.0)
MCV: 87.4 fL (ref 80.0–100.0)
Platelets: 247 10*3/uL (ref 150–400)
RBC: 4.36 MIL/uL (ref 3.87–5.11)
RDW: 15.1 % (ref 11.5–15.5)
WBC: 8.6 10*3/uL (ref 4.0–10.5)
nRBC: 0 % (ref 0.0–0.2)

## 2020-07-10 NOTE — Progress Notes (Signed)
PROGRESS NOTE        PATIENT DETAILS Name: Kristen Fletcher Age: 55 y.o. Sex: female Date of Birth: 1966-03-04 Admit Date: 07/07/2020 Admitting Physician Hillary Bow, DO PCP:Pcp, No  Brief Narrative: Patient is a 55 y.o. female with history of scleroderma, Raynaud's syndrome-PAD-presenting with left fourth, fifth digit gangrene and pain.  Subsequently admitted to the hospitalist service.  See below for further details.  Significant events: 3/31>> severe pain-gangrenous fourth and fifth left digit of her left foot-admit to [facility].  Significant studies: 3/31>> x-ray left foot: No fracture dislocation.  Antimicrobial therapy: Vancomycin: 3/31>>  Microbiology data: 3/31>> blood culture: No growth.  Procedures : None  Consults: Orthopedics  DVT Prophylaxis : enoxaparin (LOVENOX) injection 40 mg Start: 07/08/20 1000   Subjective: Foot pain is somewhat better today.  Foot appears unchanged to me.  Assessment/Plan: Dry gangrene involving left fourth and fifth toes: Foot appears unchanged-pain seems to be somewhat better.  Continue IV vancomycin-Dr. Lajoyce Corners to reevaluate in 4/4 to decide if patient requires transmetatarsal amputation.  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  Neuropathic pain: Continue Neurontin-on as needed morphine.  History of scleroderma/Raynaud's phenomena  Diet: Diet Order            Diet regular Room service appropriate? Yes; Fluid consistency: Thin  Diet effective now                  Code Status: Full code   Family Communication: None at bedside  Disposition Plan: Status is:   The patient will require care spanning > 2 midnights and should be moved to inpatient because: Inpatient level of care appropriate due to severity of illness  Dispo: The patient is from: Home              Anticipated d/c is to:  Home              Patient currently is not medically stable to d/c.   Difficult to place patient No    Barriers to Discharge: Dry gangrene in the left fourth/fifth/great toe-orthopedics to reevaluate on 4/4 if patient requires transmetatarsal amputation.  Antimicrobial agents: Anti-infectives (From admission, onward)   Start     Dose/Rate Route Frequency Ordered Stop   07/08/20 1100  vancomycin (VANCOREADY) IVPB 750 mg/150 mL        750 mg 150 mL/hr over 60 Minutes Intravenous Every 12 hours 07/07/20 2226     07/07/20 2230  vancomycin (VANCOCIN) 1,750 mg in sodium chloride 0.9 % 500 mL IVPB        1,750 mg 250 mL/hr over 120 Minutes Intravenous  Once 07/07/20 2226 07/08/20 0207       Time spent: 25- minutes-Greater than 50% of this time was spent in counseling, explanation of diagnosis, planning of further management, and coordination of care.  MEDICATIONS: Scheduled Meds: . vitamin C  500 mg Oral Daily  . enoxaparin (LOVENOX) injection  40 mg Subcutaneous Q24H  . gabapentin  300 mg Oral TID  . multivitamin with minerals  1 tablet Oral Daily  . nitroGLYCERIN  0.2 mg Transdermal Daily  . nutrition supplement (JUVEN)  1 packet Oral BID BM  . pentoxifylline  400 mg Oral TID WC   Continuous Infusions: . vancomycin 750 mg (07/10/20  1106)   PRN Meds:.acetaminophen **OR** acetaminophen, ibuprofen, morphine injection, ondansetron **OR** ondansetron (ZOFRAN) IV   PHYSICAL EXAM: Vital signs: Vitals:   07/09/20 1605 07/09/20 2052 07/10/20 0435 07/10/20 1023  BP: (!) 152/75 (!) 120/53 113/60 130/66  Pulse: 73 62 (!) 55 60  Resp: 18  16 18   Temp: 98.1 F (36.7 C) 98.1 F (36.7 C) 98 F (36.7 C) 98.3 F (36.8 C)  TempSrc: Oral Oral Oral Oral  SpO2: 98% 98% 96% 96%  Weight:      Height:       Filed Weights   07/08/20 1400  Weight: 125.6 kg   Body mass index is 43.37 kg/m.   Gen Exam:Alert awake-not in any distress HEENT:atraumatic, normocephalic Chest: B/L clear to  auscultation anteriorly CVS:S1S2 regular Abdomen:soft non tender, non distended Extremities:no edema Neurology: Non focal Skin: no rash  I have personally reviewed following labs and imaging studies  LABORATORY DATA: CBC: Recent Labs  Lab 07/07/20 1812 07/10/20 0146  WBC 8.9 8.6  NEUTROABS 6.0  --   HGB 14.1 12.5  HCT 43.7 38.1  MCV 88.6 87.4  PLT 272 247    Basic Metabolic Panel: Recent Labs  Lab 07/07/20 1812 07/10/20 0146  NA 138 137  K 4.0 3.9  CL 109 106  CO2 22 23  GLUCOSE 105* 79  BUN 14 20  CREATININE 0.85 0.83  CALCIUM 9.0 9.0    GFR: Estimated Creatinine Clearance: 104.2 mL/min (by C-G formula based on SCr of 0.83 mg/dL).  Liver Function Tests: Recent Labs  Lab 07/07/20 1812  AST 26  ALT 27  ALKPHOS 71  BILITOT 0.6  PROT 6.7  ALBUMIN 3.2*   No results for input(s): LIPASE, AMYLASE in the last 168 hours. No results for input(s): AMMONIA in the last 168 hours.  Coagulation Profile: No results for input(s): INR, PROTIME in the last 168 hours.  Cardiac Enzymes: No results for input(s): CKTOTAL, CKMB, CKMBINDEX, TROPONINI in the last 168 hours.  BNP (last 3 results) No results for input(s): PROBNP in the last 8760 hours.  Lipid Profile: No results for input(s): CHOL, HDL, LDLCALC, TRIG, CHOLHDL, LDLDIRECT in the last 72 hours.  Thyroid Function Tests: No results for input(s): TSH, T4TOTAL, FREET4, T3FREE, THYROIDAB in the last 72 hours.  Anemia Panel: No results for input(s): VITAMINB12, FOLATE, FERRITIN, TIBC, IRON, RETICCTPCT in the last 72 hours.  Urine analysis:    Component Value Date/Time   COLORURINE YELLOW 09/22/2013 0806   APPEARANCEUR CLEAR 09/22/2013 0806   LABSPEC 1.024 09/22/2013 0806   PHURINE 5.0 09/22/2013 0806   GLUCOSEU NEG 09/22/2013 0806   HGBUR NEG 09/22/2013 0806   BILIRUBINUR NEG 09/22/2013 0806   KETONESUR NEG 09/22/2013 0806   PROTEINUR NEG 09/22/2013 0806   UROBILINOGEN 0.2 09/22/2013 0806   NITRITE  NEG 09/22/2013 0806   LEUKOCYTESUR NEG 09/22/2013 0806    Sepsis Labs: Lactic Acid, Venous No results found for: LATICACIDVEN  MICROBIOLOGY: Recent Results (from the past 240 hour(s))  Blood culture (routine x 2)     Status: None (Preliminary result)   Collection Time: 07/07/20 11:00 PM   Specimen: BLOOD RIGHT HAND  Result Value Ref Range Status   Specimen Description BLOOD RIGHT HAND  Final   Special Requests   Final    BOTTLES DRAWN AEROBIC AND ANAEROBIC Blood Culture adequate volume   Culture   Final    NO GROWTH 1 DAY Performed at [facility], [address] [place], [place]  Report Status PENDING  Incomplete  Blood culture (routine x 2)     Status: None (Preliminary result)   Collection Time: 07/07/20 11:31 PM   Specimen: BLOOD LEFT HAND  Result Value Ref Range Status   Specimen Description BLOOD LEFT HAND  Final   Special Requests   Final    BOTTLES DRAWN AEROBIC AND ANAEROBIC Blood Culture results may not be optimal due to an inadequate volume of blood received in culture bottles   Culture   Final    NO GROWTH 1 DAY Performed at [facility], [address], [place], [place] [address]    Report Status PENDING  Incomplete  SARS CORONAVIRUS 2 (TAT 6-24 HRS)     Status: None   Collection Time: 07/08/20 12:08 AM  Result Value Ref Range Status   SARS Coronavirus 2 NEGATIVE NEGATIVE Final    Comment: (NOTE) SARS-CoV-2 target nucleic acids are NOT DETECTED.  The SARS-CoV-2 RNA is generally detectable in upper and lower respiratory specimens during the acute phase of infection. Negative results do not preclude SARS-CoV-2 infection, do not rule out co-infections with other pathogens, and should not be used as the sole basis for treatment or other patient management decisions. Negative results must be combined with clinical observations, patient history, and epidemiological information. The expected result is Negative.  Fact Sheet for  Patients: HairSlick.no  Fact Sheet for Healthcare Providers: quierodirigir.com  This test is not yet approved or cleared by the [place] FDA and  has been authorized for detection and/or diagnosis of SARS-CoV-2 by FDA under an Emergency Use Authorization (EUA). This EUA will remain  in effect (meaning this test can be used) for the duration of the COVID-19 declaration under Se ction 564(b)(1) of the Act, 21 U.S.C. section 360bbb-3(b)(1), unless the authorization is terminated or revoked sooner.  Performed at [facility], [address], [place], [place] [address]     RADIOLOGY STUDIES/RESULTS: No results found.   LOS: 2 days   Jeoffrey Massed, MD  [facility]    To contact the attending provider between 7A-7P or the covering provider during after hours 7P-7A, please log into the web site [email or web address] and access using universal  password for that web site. If you do not have the password, please call the hospital operator.  07/10/2020, 11:17 AM

## 2020-07-11 ENCOUNTER — Encounter (HOSPITAL_COMMUNITY): Payer: Self-pay | Admitting: Internal Medicine

## 2020-07-11 ENCOUNTER — Other Ambulatory Visit (HOSPITAL_COMMUNITY): Payer: Self-pay

## 2020-07-11 DIAGNOSIS — I96 Gangrene, not elsewhere classified: Secondary | ICD-10-CM

## 2020-07-11 HISTORY — DX: Gangrene, not elsewhere classified: I96

## 2020-07-11 MED ORDER — PENTOXIFYLLINE ER 400 MG PO TBCR: 400.0000 mg | EXTENDED_RELEASE_TABLET | Freq: Three times a day (TID) | ORAL | 3 refills | Status: DC

## 2020-07-11 MED ORDER — NITROGLYCERIN 0.2 MG/HR TD PT24: 0.2000 mg | MEDICATED_PATCH | Freq: Every day | TRANSDERMAL | 12 refills | Status: DC

## 2020-07-11 MED ORDER — NITROGLYCERIN 0.2 MG/HR TD PT24
0.2000 mg | MEDICATED_PATCH | Freq: Every day | TRANSDERMAL | 0 refills | Status: DC
  Filled 2020-07-11: qty 30, 30d supply, fill #0

## 2020-07-11 MED ORDER — PENTOXIFYLLINE ER 400 MG PO TBCR
400.0000 mg | EXTENDED_RELEASE_TABLET | Freq: Three times a day (TID) | ORAL | 0 refills | Status: DC
  Filled 2020-07-11: qty 90, 30d supply, fill #0

## 2020-07-11 NOTE — Discharge Summary (Signed)
PATIENT DETAILS Name: Kristen Fletcher Age: 55 y.o. Sex: female Date of Birth: Jun 14, 1965 MRN: [id number]. Admitting Physician: Hillary Bow, DO PCP:Pcp, No  Admit Date: 07/07/2020 Discharge date: 07/11/2020  Recommendations for Outpatient Follow-up:  1. Follow up with PCP in 1-2 weeks 2. Please obtain CMP/CBC in one week 3. Please ensure follow-up with orthopedics-Dr. Lajoyce Corners  Admitted From:  Home  Disposition: Home   Home Health: No  Equipment/Devices: None  Discharge Condition: Stable  CODE STATUS: FULL CODE  Diet recommendation:  Diet Order            Diet - low sodium heart healthy           Diet regular Room service appropriate? Yes; Fluid consistency: Thin  Diet effective now                 Brief Narrative: Patient is a 55 y.o. female with history of scleroderma, Raynaud's syndrome-PAD-presenting with left fourth, fifth digit gangrene and pain.  Subsequently admitted to the hospitalist service.  See below for further details.  Significant events: 3/31>> severe pain-gangrenous fourth and fifth left digit of her left foot-admit to [facility].  Significant studies: 3/31>> x-ray left foot: No fracture dislocation.  Antimicrobial therapy: Vancomycin: 3/31>>4/4  Microbiology data: 3/31>> blood culture: No growth.  Procedures : None  Consults: Orthopedics  Brief Hospital Course: Dry gangrene involving left fourth and fifth toes:  Managed with supportive care-evaluated by orthopedics-started on nitroglycerin and Trental-pain has improved significantly with these measures.  Dr. Lajoyce Corners recommends continued observation-and does not recommend proceeding with transmetatarsal amputation right away.  He will arrange for follow-up in his clinic for further continued care.  She will be discharged home on nitroglycerin and Trental.  Dr. Lajoyce Corners does not recommend continuing antimicrobial therapy on discharge.  As noted in prior notes-patient was evaluated by vascular  surgery in [facility] was deemed to have small vessel disease which is not amenable to intervention.    Neuropathic pain: Continue Neurontin  History of scleroderma/Raynaud's phenomena  Morbid Obesity: Estimated body mass index is 43.37 kg/m as calculated from the following:   Height as of this encounter: 5\' 7"  (1.702 m).   Weight as of this encounter: 125.6 kg.     Discharge Diagnoses:  Principal Problem:   Gangrene of toe (HCC) Active Problems:   SCLERODERMA   Discharge Instructions:  Activity:  As tolerated  Discharge Instructions    Call MD for:  persistant nausea and vomiting   Complete by: As directed    Call MD for:  redness, tenderness, or signs of infection (pain, swelling, redness, odor or green/yellow discharge around incision site)   Complete by: As directed    Call MD for:  severe uncontrolled pain   Complete by: As directed    Diet - low sodium heart healthy   Complete by: As directed    Increase activity slowly   Complete by: As directed      Allergies as of 07/11/2020   No Known Allergies     Medication List    TAKE these medications   CENTRUM SILVER 50+WOMEN PO Take 1 tablet by mouth daily.   gabapentin 300 MG capsule Commonly known as: NEURONTIN Take 300 mg by mouth 3 (three) times daily.   ibuprofen 200 MG tablet Commonly known as: ADVIL Take 800 mg by mouth every 6 (six) hours as needed for mild pain.   nitroGLYCERIN 0.2 mg/hr patch Commonly known as: NITRODUR - Dosed in mg/24  hr Place 1 patch (0.2 mg total) onto the skin daily.   pentoxifylline 400 MG CR tablet Commonly known as: TRENTAL Take 1 tablet (400 mg total) by mouth 3 (three) times daily with meals.       Follow-up Information    Nadara Mustarduda, Marcus V, MD Follow up in 1 week(s).   Specialty: Orthopedic Surgery Contact information: [address] [place] [address] [phone]        Primary care MD. Schedule an appointment as soon as  possible for a visit in 1 week(s).              No Known Allergies     Other Procedures/Studies: DG Foot Complete Left  Result Date: 07/07/2020 CLINICAL DATA:  Pain with discoloration distally EXAM: LEFT FOOT - COMPLETE 3+ VIEW COMPARISON:  July 06, 2014 FINDINGS: Frontal, oblique, and lateral views were obtained. Bones are diffusely osteoporotic. No acute fracture or dislocation. Joint spaces appear unremarkable. No erosive change or bony destruction. There are small posterior and inferior calcaneal spurs. There is a stable exostosis along the dorsal distal talus. Benign calcification is noted medial to the medial cuneiform bone distally, stable. IMPRESSION: Diffuse osteoporosis. No bony destruction or erosion. No fracture or dislocation. No appreciable joint space narrowing. Small calcaneal spurs noted. Electronically Signed   By: Bretta BangWilliam  Woodruff III M.D.   On: 07/07/2020 18:34     TODAY-DAY OF DISCHARGE:  Subjective:   Kristen Fletcher today has no headache,no chest abdominal pain,no new weakness tingling or numbness, feels much better wants to go home today.   Objective:   Blood pressure 122/62, pulse (!) 59, temperature 98.2 F (36.8 C), temperature source Oral, resp. rate 16, height 5\' 7"  (1.702 m), weight 125.6 kg, SpO2 97 %.  Intake/Output Summary (Last 24 hours) at 07/11/2020 0947 Last data filed at 07/11/2020 0941 Gross per 24 hour  Intake 300 ml  Output 1250 ml  Net -950 ml   Filed Weights   07/08/20 1400  Weight: 125.6 kg    Exam: Awake Alert, Oriented *3, No new F.N deficits, Normal affect [place].AT,PERRAL Supple Neck,No JVD, No cervical lymphadenopathy appriciated.  Symmetrical Chest wall movement, Good air movement bilaterally, CTAB RRR,No Gallops,Rubs or new Murmurs, No Parasternal Heave +ve B.Sounds, Abd Soft, Non tender, No organomegaly appriciated, No rebound -guarding or rigidity. No Cyanosis, Clubbing or edema, No new Rash or bruise   PERTINENT  RADIOLOGIC STUDIES: No results found.   PERTINENT LAB RESULTS: CBC: Recent Labs    07/10/20 0146  WBC 8.6  HGB 12.5  HCT 38.1  PLT 247   CMET CMP     Component Value Date/Time   NA 137 07/10/2020 0146   NA 140 08/08/2011 0000   K 3.9 07/10/2020 0146   CL 106 07/10/2020 0146   CO2 23 07/10/2020 0146   GLUCOSE 79 07/10/2020 0146   BUN 20 07/10/2020 0146   BUN 24 (A) 08/08/2011 0000   CREATININE 0.83 07/10/2020 0146   CREATININE 0.77 08/04/2014 0912   CALCIUM 9.0 07/10/2020 0146   PROT 6.7 07/07/2020 1812   ALBUMIN 3.2 (L) 07/07/2020 1812   AST 26 07/07/2020 1812   ALT 27 07/07/2020 1812   ALKPHOS 71 07/07/2020 1812   BILITOT 0.6 07/07/2020 1812   GFRNONAA >60 07/10/2020 0146   GFRNONAA >89 08/04/2014 0912   GFRAA >89 08/04/2014 0912    GFR Estimated Creatinine Clearance: 104.2 mL/min (by C-G formula based on SCr of 0.83 mg/dL). No results for input(s): LIPASE, AMYLASE  in the last 72 hours. No results for input(s): CKTOTAL, CKMB, CKMBINDEX, TROPONINI in the last 72 hours. Invalid input(s): POCBNP No results for input(s): DDIMER in the last 72 hours. No results for input(s): HGBA1C in the last 72 hours. No results for input(s): CHOL, HDL, LDLCALC, TRIG, CHOLHDL, LDLDIRECT in the last 72 hours. No results for input(s): TSH, T4TOTAL, T3FREE, THYROIDAB in the last 72 hours.  Invalid input(s): FREET3 No results for input(s): VITAMINB12, FOLATE, FERRITIN, TIBC, IRON, RETICCTPCT in the last 72 hours. Coags: No results for input(s): INR in the last 72 hours.  Invalid input(s): PT Microbiology: Recent Results (from the past 240 hour(s))  Blood culture (routine x 2)     Status: None (Preliminary result)   Collection Time: 07/07/20 11:00 PM   Specimen: BLOOD RIGHT HAND  Result Value Ref Range Status   Specimen Description BLOOD RIGHT HAND  Final   Special Requests   Final    BOTTLES DRAWN AEROBIC AND ANAEROBIC Blood Culture adequate volume   Culture   Final    NO  GROWTH 2 DAYS Performed at [facility], [address], [place], [place] [address]    Report Status PENDING  Incomplete  Blood culture (routine x 2)     Status: None (Preliminary result)   Collection Time: 07/07/20 11:31 PM   Specimen: BLOOD LEFT HAND  Result Value Ref Range Status   Specimen Description BLOOD LEFT HAND  Final   Special Requests   Final    BOTTLES DRAWN AEROBIC AND ANAEROBIC Blood Culture results may not be optimal due to an inadequate volume of blood received in culture bottles   Culture   Final    NO GROWTH 2 DAYS Performed at [facility], [address], [place], [place] [address]    Report Status PENDING  Incomplete  SARS CORONAVIRUS 2 (TAT 6-24 HRS)     Status: None   Collection Time: 07/08/20 12:08 AM  Result Value Ref Range Status   SARS Coronavirus 2 NEGATIVE NEGATIVE Final    Comment: (NOTE) SARS-CoV-2 target nucleic acids are NOT DETECTED.  The SARS-CoV-2 RNA is generally detectable in upper and lower respiratory specimens during the acute phase of infection. Negative results do not preclude SARS-CoV-2 infection, do not rule out co-infections with other pathogens, and should not be used as the sole basis for treatment or other patient management decisions. Negative results must be combined with clinical observations, patient history, and epidemiological information. The expected result is Negative.  Fact Sheet for Patients: HairSlick.no  Fact Sheet for Healthcare Providers: quierodirigir.com  This test is not yet approved or cleared by the [place] FDA and  has been authorized for detection and/or diagnosis of SARS-CoV-2 by FDA under an Emergency Use Authorization (EUA). This EUA will remain  in effect (meaning this test can be used) for the duration of the COVID-19 declaration under Se ction 564(b)(1) of the Act, 21 U.S.C. section 360bbb-3(b)(1), unless the authorization is  terminated or revoked sooner.  Performed at [facility], [address], [place], [place] [address]     FURTHER DISCHARGE INSTRUCTIONS:  Get Medicines reviewed and adjusted: Please take all your medications with you for your next visit with your Primary MD  Laboratory/radiological data: Please request your Primary MD to go over all hospital tests and procedure/radiological results at the follow up, please ask your Primary MD to get all Hospital records sent to his/her office.  In some cases, they will be  blood work, cultures and biopsy results pending at the time of your discharge. Please request that your primary care M.D. goes through all the records of your hospital data and follows up on these results.  Also Note the following: If you experience worsening of your admission symptoms, develop shortness of breath, life threatening emergency, suicidal or homicidal thoughts you must seek medical attention immediately by calling 911 or calling your MD immediately  if symptoms less severe.  You must read complete instructions/literature along with all the possible adverse reactions/side effects for all the Medicines you take and that have been prescribed to you. Take any new Medicines after you have completely understood and accpet all the possible adverse reactions/side effects.   Do not drive when taking Pain medications or sleeping medications (Benzodaizepines)  Do not take more than prescribed Pain, Sleep and Anxiety Medications. It is not advisable to combine anxiety,sleep and pain medications without talking with your primary care practitioner  Special Instructions: If you have smoked or chewed Tobacco  in the last 2 yrs please stop smoking, stop any regular Alcohol  and or any Recreational drug use.  Wear Seat belts while driving.  Please note: You were cared for by a hospitalist during your hospital stay. Once you are discharged, your primary care physician will handle any  further medical issues. Please note that NO REFILLS for any discharge medications will be authorized once you are discharged, as it is imperative that you return to your primary care physician (or establish a relationship with a primary care physician if you do not have one) for your post hospital discharge needs so that they can reassess your need for medications and monitor your lab values.  Total Time spent coordinating discharge including counseling, education and face to face time equals 35 minutes.  SignedJeoffrey Massed 07/11/2020 9:47 AM

## 2020-07-11 NOTE — Progress Notes (Signed)
Patient ID: Kristen Fletcher, female   DOB: 06/23/1965, 55 y.o.   MRN: [id number] Patient is seen in follow-up for ischemic changes to her toes left foot.  Examination is patient's foot is less tender to light touch.  The dry gangrenous changes to the fourth and fifth toe are stable.  The plantar aspect of the great toe has improved granulation tissue.  Patient is making slow progress.  I will write orders for discharge to continue the nitroglycerin patch and Trental.  Patient does not need any moisturized dressing on the great toe.

## 2020-07-11 NOTE — Progress Notes (Signed)
Discharge instructions given to patient, patient verbalizes understanding. IV removed. Extra bandages given to patient for toe. Patient discharged

## 2020-07-13 LAB — CULTURE, BLOOD (ROUTINE X 2)
Culture: NO GROWTH
Culture: NO GROWTH
Special Requests: ADEQUATE

## 2020-07-18 ENCOUNTER — Encounter: Payer: Self-pay | Admitting: Orthopedic Surgery

## 2020-07-18 ENCOUNTER — Ambulatory Visit (INDEPENDENT_AMBULATORY_CARE_PROVIDER_SITE_OTHER): Payer: BLUE CROSS/BLUE SHIELD | Admitting: Orthopedic Surgery

## 2020-07-18 ENCOUNTER — Other Ambulatory Visit: Payer: Self-pay

## 2020-07-18 VITALS — Ht 67.0 in | Wt 280.0 lb

## 2020-07-18 DIAGNOSIS — I96 Gangrene, not elsewhere classified: Secondary | ICD-10-CM | POA: Diagnosis not present

## 2020-07-19 ENCOUNTER — Encounter: Payer: Self-pay | Admitting: Orthopedic Surgery

## 2020-07-19 NOTE — Progress Notes (Signed)
Office Visit Note   Patient: Kristen Fletcher           Date of Birth: Dec 06, 1965           MRN: [id number] Visit Date: 07/18/2020              Requested by: No referring provider defined for this encounter. PCP: Pcp, No  Chief Complaint  Patient presents with  . Left Foot - Pain      HPI: Patient is a 55 year old woman who presents in follow-up for gangrenous changes left foot great toe and fourth toe.  Patient states she had increased throbbing pain that started Saturday she is currently on Trental and nitroglycerin patches.  Patient is concerned with her increased pain.  Patient states she is also taking Neurontin.  Assessment & Plan: Visit Diagnoses:  1. Gangrene of toe (HCC)     Plan: Continue with current care her wounds continue to slowly improve.  Follow-Up Instructions: Return in about 2 weeks (around 08/01/2020).   Ortho Exam  Patient is alert, oriented, no adenopathy, well-dressed, normal affect, normal respiratory effort. Examinations patient has a strong dorsalis pedis pulse.  There is no ascending cellulitis no sausage digit swelling no signs of infection.  The left great toe ulcer is showing improvement with granulation tissue the dry gangrene is stable of the fourth and fifth toes.  A nitroglycerin patch was applied in the office.  Patient is currently taking Motrin 800 mg 3 times a day for pain.  Imaging: No results found. No images are attached to the encounter.  Labs: Lab Results  Component Value Date   HGBA1C 5.8 (H) 07/08/2020   ESRSEDRATE 22 07/08/2020   CRP 0.8 07/08/2020   REPTSTATUS 07/13/2020 FINAL 07/07/2020   CULT  07/07/2020    NO GROWTH 5 DAYS Performed at [facility], [address] [place], [place] [address]      Lab Results  Component Value Date   ALBUMIN 3.2 (L) 07/07/2020   ALBUMIN 4.1 08/04/2014   ALBUMIN 4.2 07/06/2014   PREALBUMIN 15.3 (L) 07/08/2020    No results found for: MG Lab Results  Component Value Date    VD25OH 41 09/22/2013   VD25OH 22 (L) 09/24/2011    Lab Results  Component Value Date   PREALBUMIN 15.3 (L) 07/08/2020   CBC EXTENDED Latest Ref Rng & Units 07/10/2020 07/07/2020 09/22/2013  WBC 4.0 - 10.5 K/uL 8.6 8.9 7.4  RBC 3.87 - 5.11 MIL/uL 4.36 4.93 5.53(H)  HGB 12.0 - 15.0 g/dL 62.2 29.7 16.3(H)  HCT 36.0 - 46.0 % 38.1 43.7 48.4(H)  PLT 150 - 400 K/uL 247 272 251  NEUTROABS 1.7 - 7.7 K/uL - 6.0 -  LYMPHSABS 0.7 - 4.0 K/uL - 1.9 -     Body mass index is 43.85 kg/m.  Orders:  No orders of the defined types were placed in this encounter.  No orders of the defined types were placed in this encounter.    Procedures: No procedures performed  Clinical Data: No additional findings.  ROS:  All other systems negative, except as noted in the HPI. Review of Systems  Objective: Vital Signs: Ht 5\' 7"  (1.702 m)   Wt 280 lb (127 kg)   BMI 43.85 kg/m   Specialty Comments:  No specialty comments available.  PMFS History: Patient Active Problem List   Diagnosis Date Noted  . Gangrene of toe (HCC) 07/08/2020  . Primary osteoarthritis of left foot 07/07/2014  . Other and  unspecified hyperlipidemia 09/24/2011  . STRESS REACTION, ACUTE 01/09/2010  . ARM PAIN 01/09/2010  . DYSHIDROTIC ECZEMA 08/10/2009  . SCLERODERMA 12/22/2008   Past Medical History:  Diagnosis Date  . Gangrene (HCC) 07/11/2020   toes on left foot  . Scleroderma (HCC)    Dr Norina Buzzard    Family History  Problem Relation Age of Onset  . Heart attack Father        early 4's  . Breast cancer Mother   . Diabetes Maternal Grandmother     Past Surgical History:  Procedure Laterality Date  . CERVICAL DISC SURGERY  2005   Social History   Occupational History  . Occupation: works form Soil scientist: [employer]  Tobacco Use  . Smoking status: Former Smoker    Packs/day: 0.50    Years: 30.00    Pack years: 15.00    Types: Cigarettes    Quit date: 04/2020    Years since  quitting: 0.2  . Smokeless tobacco: Never Used  Vaping Use  . Vaping Use: Never used  Substance and Sexual Activity  . Alcohol use: No  . Drug use: No  . Sexual activity: Not on file

## 2020-08-01 ENCOUNTER — Ambulatory Visit (INDEPENDENT_AMBULATORY_CARE_PROVIDER_SITE_OTHER): Payer: BLUE CROSS/BLUE SHIELD | Admitting: Orthopedic Surgery

## 2020-08-01 ENCOUNTER — Encounter: Payer: Self-pay | Admitting: Orthopedic Surgery

## 2020-08-01 DIAGNOSIS — I96 Gangrene, not elsewhere classified: Secondary | ICD-10-CM

## 2020-08-01 MED ORDER — PENTOXIFYLLINE ER 400 MG PO TBCR: 400.0000 mg | EXTENDED_RELEASE_TABLET | Freq: Three times a day (TID) | ORAL | 3 refills | Status: DC

## 2020-08-01 MED ORDER — NITROGLYCERIN 0.2 MG/HR TD PT24: 0.2000 mg | MEDICATED_PATCH | Freq: Every day | TRANSDERMAL | 12 refills | Status: AC

## 2020-08-01 NOTE — Progress Notes (Signed)
Office Visit Note   Patient: Kristen Fletcher           Date of Birth: [birth year]           MRN: [id number] Visit Date: 08/01/2020              Requested by: No referring provider defined for this encounter. PCP: Pcp, No  Chief Complaint  Patient presents with  . Left Foot - Follow-up      HPI: Patient is a 55 year old woman who presents in follow-up for gangrenous changes to the toes on the left foot.  She is currently using a nitroglycerin patch and Trental as well as Neurontin.  Assessment & Plan: Visit Diagnoses:  1. Gangrene of toe (HCC)     Plan: Patient continues to show slow improvement though she still has occasional ischemic pain.  New prescriptions called in for nitroglycerin and Trental.  Follow-Up Instructions: Return in about 4 weeks (around 08/29/2020).   Ortho Exam  Patient is alert, oriented, no adenopathy, well-dressed, normal affect, normal respiratory effort. Examination patient's left foot continues to improve the gangrenous ischemic changes to the left great toe fourth and fifth toe appear more superficial.  There is no ascending cellulitis no swelling no signs of infection.  Imaging: No results found. No images are attached to the encounter.  Labs: Lab Results  Component Value Date   HGBA1C 5.8 (H) 07/08/2020   ESRSEDRATE 22 07/08/2020   CRP 0.8 07/08/2020   REPTSTATUS 07/13/2020 FINAL 07/07/2020   CULT  07/07/2020    NO GROWTH 5 DAYS Performed at [facility], [address] [place], [place] [address]      Lab Results  Component Value Date   ALBUMIN 3.2 (L) 07/07/2020   ALBUMIN 4.1 08/04/2014   ALBUMIN 4.2 07/06/2014   PREALBUMIN 15.3 (L) 07/08/2020    No results found for: MG Lab Results  Component Value Date   VD25OH 41 09/22/2013   VD25OH 22 (L) 09/24/2011    Lab Results  Component Value Date   PREALBUMIN 15.3 (L) 07/08/2020   CBC EXTENDED Latest Ref Rng & Units 07/10/2020 07/07/2020 09/22/2013  WBC 4.0 - 10.5 K/uL 8.6  8.9 7.4  RBC 3.87 - 5.11 MIL/uL 4.36 4.93 5.53(H)  HGB 12.0 - 15.0 g/dL 67.2 09.4 16.3(H)  HCT 36.0 - 46.0 % 38.1 43.7 48.4(H)  PLT 150 - 400 K/uL 247 272 251  NEUTROABS 1.7 - 7.7 K/uL - 6.0 -  LYMPHSABS 0.7 - 4.0 K/uL - 1.9 -     There is no height or weight on file to calculate BMI.  Orders:  No orders of the defined types were placed in this encounter.  Meds ordered this encounter  Medications  . pentoxifylline (TRENTAL) 400 MG CR tablet    Sig: Take 1 tablet (400 mg total) by mouth 3 (three) times daily with meals.    Dispense:  90 tablet    Refill:  3  . nitroGLYCERIN (NITRODUR - DOSED IN MG/24 HR) 0.2 mg/hr patch    Sig: Place 1 patch (0.2 mg total) onto the skin daily.    Dispense:  30 patch    Refill:  12     Procedures: No procedures performed  Clinical Data: No additional findings.  ROS:  All other systems negative, except as noted in the HPI. Review of Systems  Objective: Vital Signs: There were no vitals taken for this visit.  Specialty Comments:  No specialty comments available.  PMFS History: Patient  Active Problem List   Diagnosis Date Noted  . Gangrene of toe (HCC) 07/08/2020  . Primary osteoarthritis of left foot 07/07/2014  . Other and unspecified hyperlipidemia 09/24/2011  . STRESS REACTION, ACUTE 01/09/2010  . ARM PAIN 01/09/2010  . DYSHIDROTIC ECZEMA 08/10/2009  . SCLERODERMA 12/22/2008   Past Medical History:  Diagnosis Date  . Gangrene (HCC) 07/11/2020   toes on left foot  . Scleroderma (HCC)    Dr Norina Buzzard    Family History  Problem Relation Age of Onset  . Heart attack Father        early 36's  . Breast cancer Mother   . Diabetes Maternal Grandmother     Past Surgical History:  Procedure Laterality Date  . CERVICAL DISC SURGERY  2005   Social History   Occupational History  . Occupation: works form Soil scientist: [employer]  Tobacco Use  . Smoking status: Former Smoker    Packs/day: 0.50    Years:  30.00    Pack years: 15.00    Types: Cigarettes    Quit date: 04/2020    Years since quitting: 0.3  . Smokeless tobacco: Never Used  Vaping Use  . Vaping Use: Never used  Substance and Sexual Activity  . Alcohol use: No  . Drug use: No  . Sexual activity: Not on file

## 2020-08-29 ENCOUNTER — Other Ambulatory Visit: Payer: Self-pay

## 2020-08-29 ENCOUNTER — Encounter: Payer: Self-pay | Admitting: Physician Assistant

## 2020-08-29 ENCOUNTER — Ambulatory Visit (INDEPENDENT_AMBULATORY_CARE_PROVIDER_SITE_OTHER): Payer: BLUE CROSS/BLUE SHIELD | Admitting: Physician Assistant

## 2020-08-29 DIAGNOSIS — I96 Gangrene, not elsewhere classified: Secondary | ICD-10-CM

## 2020-08-29 NOTE — Progress Notes (Signed)
Office Visit Note   Patient: Kristen Fletcher           Date of Birth: 1965/09/10           MRN: [id number] Visit Date: 08/29/2020              Requested by: No referring provider defined for this encounter. PCP: Pcp, No  No chief complaint on file.     HPI: Patient presents in follow-up today for her gangrene in her left toes.  She has been good about using her nitroglycerin patch and taking Trental.  She feels she is improving  Assessment & Plan: Visit Diagnoses: No diagnosis found.  Plan: Follow-up in 1 month continue current treatment  Follow-Up Instructions: No follow-ups on file.   Ortho Exam  Patient is alert, oriented, no adenopathy, well-dressed, normal affect, normal respiratory effort. Left foot the gangrene in the great toe has all but completely resolved.  There is still a little eschar under the plantar surface but this is superficial and not deep.  Most of the gangrene has also resolved on her fifth toe with healthy pink tissue on both of these toes.  Fourth toe still has necrotic changes from the PIP joint circumferentially.  This is slightly improved and stable.  No ascending cellulitis no signs of infection  Imaging: No results found. No images are attached to the encounter.  Labs: Lab Results  Component Value Date   HGBA1C 5.8 (H) 07/08/2020   ESRSEDRATE 22 07/08/2020   CRP 0.8 07/08/2020   REPTSTATUS 07/13/2020 FINAL 07/07/2020   CULT  07/07/2020    NO GROWTH 5 DAYS Performed at [facility], [address] [place], [place] [address]      Lab Results  Component Value Date   ALBUMIN 3.2 (L) 07/07/2020   ALBUMIN 4.1 08/04/2014   ALBUMIN 4.2 07/06/2014   PREALBUMIN 15.3 (L) 07/08/2020    No results found for: MG Lab Results  Component Value Date   VD25OH 41 09/22/2013   VD25OH 22 (L) 09/24/2011    Lab Results  Component Value Date   PREALBUMIN 15.3 (L) 07/08/2020   CBC EXTENDED Latest Ref Rng & Units 07/10/2020 07/07/2020 09/22/2013   WBC 4.0 - 10.5 K/uL 8.6 8.9 7.4  RBC 3.87 - 5.11 MIL/uL 4.36 4.93 5.53(H)  HGB 12.0 - 15.0 g/dL 42.7 06.2 16.3(H)  HCT 36.0 - 46.0 % 38.1 43.7 48.4(H)  PLT 150 - 400 K/uL 247 272 251  NEUTROABS 1.7 - 7.7 K/uL - 6.0 -  LYMPHSABS 0.7 - 4.0 K/uL - 1.9 -     There is no height or weight on file to calculate BMI.  Orders:  No orders of the defined types were placed in this encounter.  No orders of the defined types were placed in this encounter.    Procedures: No procedures performed  Clinical Data: No additional findings.  ROS:  All other systems negative, except as noted in the HPI. Review of Systems  Objective: Vital Signs: There were no vitals taken for this visit.  Specialty Comments:  No specialty comments available.  PMFS History: Patient Active Problem List   Diagnosis Date Noted  . Gangrene of toe (HCC) 07/08/2020  . Primary osteoarthritis of left foot 07/07/2014  . Other and unspecified hyperlipidemia 09/24/2011  . STRESS REACTION, ACUTE 01/09/2010  . ARM PAIN 01/09/2010  . DYSHIDROTIC ECZEMA 08/10/2009  . SCLERODERMA 12/22/2008   Past Medical History:  Diagnosis Date  . Gangrene (HCC) 07/11/2020  toes on left foot  . Scleroderma (HCC)    Dr Norina Buzzard    Family History  Problem Relation Age of Onset  . Heart attack Father        early 31's  . Breast cancer Mother   . Diabetes Maternal Grandmother     Past Surgical History:  Procedure Laterality Date  . CERVICAL DISC SURGERY  2005   Social History   Occupational History  . Occupation: works form Soil scientist: [employer]  Tobacco Use  . Smoking status: Former Smoker    Packs/day: 0.50    Years: 30.00    Pack years: 15.00    Types: Cigarettes    Quit date: 04/2020    Years since quitting: 0.3  . Smokeless tobacco: Never Used  Vaping Use  . Vaping Use: Never used  Substance and Sexual Activity  . Alcohol use: No  . Drug use: No  . Sexual activity: Not on file

## 2020-09-26 ENCOUNTER — Ambulatory Visit: Payer: BLUE CROSS/BLUE SHIELD | Admitting: Orthopedic Surgery

## 2020-09-26 ENCOUNTER — Encounter: Payer: Self-pay | Admitting: Orthopedic Surgery

## 2020-09-26 DIAGNOSIS — I96 Gangrene, not elsewhere classified: Secondary | ICD-10-CM | POA: Diagnosis not present

## 2020-09-26 NOTE — Progress Notes (Signed)
Office Visit Note   Patient: Kristen Fletcher           Date of Birth: Sep 26, 1965           MRN: [id number] Visit Date: 09/26/2020              Requested by: No referring provider defined for this encounter. PCP: Pcp, No  Chief Complaint  Patient presents with   Left Foot - Follow-up      HPI: Patient is a 55 year old woman is seen in follow-up for gangrenous changes left forefoot.  She is currently using a nitroglycerin patch she states that recently the little toe was pink and then turned red.  Patient states she is having increasing swelling in both lower extremities.  Assessment & Plan: Visit Diagnoses:  1. Gangrene of toe (HCC)     Plan: Recommended patient get a pair of double extra-large compression socks to wear daily.  Continue the nitroglycerin patch.  Follow-Up Instructions: Return in about 4 weeks (around 10/24/2020).   Ortho Exam  Patient is alert, oriented, no adenopathy, well-dressed, normal affect, normal respiratory effort. Examination patient's gangrenous ulcers to the toes of the left foot continue to improve get smaller and are superficial.  She has a palpable dorsalis pedis pulse she is wearing the nitroglycerin patch there is no cellulitis or drainage from the ulcers she does have pitting edema in her foot and calf.  She has thickened discolored onychomycotic nails x9 and the nails were trimmed x9 without complications.  Imaging: No results found. No images are attached to the encounter.  Labs: Lab Results  Component Value Date   HGBA1C 5.8 (H) 07/08/2020   ESRSEDRATE 22 07/08/2020   CRP 0.8 07/08/2020   REPTSTATUS 07/13/2020 FINAL 07/07/2020   CULT  07/07/2020    NO GROWTH 5 DAYS Performed at [facility], [address], [place], [place] [address]      Lab Results  Component Value Date   ALBUMIN 3.2 (L) 07/07/2020   ALBUMIN 4.1 08/04/2014   ALBUMIN 4.2 07/06/2014   PREALBUMIN 15.3 (L) 07/08/2020    No results found for: MG Lab  Results  Component Value Date   VD25OH 41 09/22/2013   VD25OH 22 (L) 09/24/2011    Lab Results  Component Value Date   PREALBUMIN 15.3 (L) 07/08/2020   CBC EXTENDED Latest Ref Rng & Units 07/10/2020 07/07/2020 09/22/2013  WBC 4.0 - 10.5 K/uL 8.6 8.9 7.4  RBC 3.87 - 5.11 MIL/uL 4.36 4.93 5.53(H)  HGB 12.0 - 15.0 g/dL 58.5 27.7 16.3(H)  HCT 36.0 - 46.0 % 38.1 43.7 48.4(H)  PLT 150 - 400 K/uL 247 272 251  NEUTROABS 1.7 - 7.7 K/uL - 6.0 -  LYMPHSABS 0.7 - 4.0 K/uL - 1.9 -     There is no height or weight on file to calculate BMI.  Orders:  No orders of the defined types were placed in this encounter.  No orders of the defined types were placed in this encounter.    Procedures: No procedures performed  Clinical Data: No additional findings.  ROS:  All other systems negative, except as noted in the HPI. Review of Systems  Objective: Vital Signs: There were no vitals taken for this visit.  Specialty Comments:  No specialty comments available.  PMFS History: Patient Active Problem List   Diagnosis Date Noted   Gangrene of toe (HCC) 07/08/2020   Primary osteoarthritis of left foot 07/07/2014   Other and unspecified hyperlipidemia 09/24/2011  STRESS REACTION, ACUTE 01/09/2010   ARM PAIN 01/09/2010   DYSHIDROTIC ECZEMA 08/10/2009   SCLERODERMA 12/22/2008   Past Medical History:  Diagnosis Date   Gangrene (HCC) 07/11/2020   toes on left foot   Scleroderma (HCC)    Dr Norina Buzzard    Family History  Problem Relation Age of Onset   Heart attack Father        early 55's   Breast cancer Mother    Diabetes Maternal Grandmother     Past Surgical History:  Procedure Laterality Date   CERVICAL DISC SURGERY  2005   Social History   Occupational History   Occupation: works form home    Employer: [employer]  Tobacco Use   Smoking status: Former    Packs/day: 0.50    Years: 30.00    Pack years: 15.00    Types: Cigarettes    Quit date: 04/2020     Years since quitting: 0.4   Smokeless tobacco: Never  Vaping Use   Vaping Use: Never used  Substance and Sexual Activity   Alcohol use: No   Drug use: No   Sexual activity: Not on file

## 2020-10-27 ENCOUNTER — Encounter: Payer: Self-pay | Admitting: Orthopedic Surgery

## 2020-10-27 ENCOUNTER — Other Ambulatory Visit: Payer: Self-pay

## 2020-10-27 ENCOUNTER — Ambulatory Visit (INDEPENDENT_AMBULATORY_CARE_PROVIDER_SITE_OTHER): Payer: BLUE CROSS/BLUE SHIELD | Admitting: Physician Assistant

## 2020-10-27 DIAGNOSIS — I96 Gangrene, not elsewhere classified: Secondary | ICD-10-CM

## 2020-10-27 MED ORDER — DOXYCYCLINE HYCLATE 100 MG PO TABS: 100.0000 mg | ORAL_TABLET | Freq: Two times a day (BID) | ORAL | 0 refills | Status: AC

## 2020-10-27 NOTE — Progress Notes (Signed)
Office Visit Note   Patient: Kristen Fletcher           Date of Birth: Aug 15, 1965           MRN: [id number] Visit Date: 10/27/2020              Requested by: No referring provider defined for this encounter. PCP: Pcp, No  Chief Complaint  Patient presents with   Left Foot - Follow-up      HPI: Patient is a pleasant 55 year old woman who we have been following for necrosis of her lesser toes on her left foot.  She has been using a nitroglycerin patch and taking Trental.  She also has compression stockings though said she did not place them on today.  Her only complaint is that she continues to have pain which seems to be worse in the fifth to  Assessment & Plan: Visit Diagnoses: No diagnosis found.  Plan: Place patient on a course of doxycycline she will take probiotics with this.  Continue to do daily cleansing.  Make sure she gets the foot dry.  She will offset ever so slightly the Nitropatch to the more lateral side of her foot though just minimally.  Follow-up in 1 week for evaluation  Follow-Up Instructions: No follow-ups on file.   Ortho Exam  Patient is alert, oriented, no adenopathy, well-dressed, normal affect, normal respiratory effort. She has a easily palpable dorsalis pedis pulse.  Mild soft tissue swelling.  The left fifth toe is exquisitely tender does have some necrosis at the base.  Also some skin maceration in the fourth webspace.  No ascending cellulitis  Imaging: No results found. No images are attached to the encounter.  Labs: Lab Results  Component Value Date   HGBA1C 5.8 (H) 07/08/2020   ESRSEDRATE 22 07/08/2020   CRP 0.8 07/08/2020   REPTSTATUS 07/13/2020 FINAL 07/07/2020   CULT  07/07/2020    NO GROWTH 5 DAYS Performed at [facility], [address], [place], [place] [address]      Lab Results  Component Value Date   ALBUMIN 3.2 (L) 07/07/2020   ALBUMIN 4.1 08/04/2014   ALBUMIN 4.2 07/06/2014   PREALBUMIN 15.3 (L) 07/08/2020    No  results found for: MG Lab Results  Component Value Date   VD25OH 41 09/22/2013   VD25OH 22 (L) 09/24/2011    Lab Results  Component Value Date   PREALBUMIN 15.3 (L) 07/08/2020   CBC EXTENDED Latest Ref Rng & Units 07/10/2020 07/07/2020 09/22/2013  WBC 4.0 - 10.5 K/uL 8.6 8.9 7.4  RBC 3.87 - 5.11 MIL/uL 4.36 4.93 5.53(H)  HGB 12.0 - 15.0 g/dL 51.7 61.6 16.3(H)  HCT 36.0 - 46.0 % 38.1 43.7 48.4(H)  PLT 150 - 400 K/uL 247 272 251  NEUTROABS 1.7 - 7.7 K/uL - 6.0 -  LYMPHSABS 0.7 - 4.0 K/uL - 1.9 -     There is no height or weight on file to calculate BMI.  Orders:  No orders of the defined types were placed in this encounter.  Meds ordered this encounter  Medications   doxycycline (VIBRA-TABS) 100 MG tablet    Sig: Take 1 tablet (100 mg total) by mouth 2 (two) times daily.    Dispense:  60 tablet    Refill:  0     Procedures: No procedures performed  Clinical Data: No additional findings.  ROS:  All other systems negative, except as noted in the HPI. Review of Systems  Objective: Vital  Signs: There were no vitals taken for this visit.  Specialty Comments:  No specialty comments available.  PMFS History: Patient Active Problem List   Diagnosis Date Noted   Gangrene of toe (HCC) 07/08/2020   Primary osteoarthritis of left foot 07/07/2014   Other and unspecified hyperlipidemia 09/24/2011   STRESS REACTION, ACUTE 01/09/2010   ARM PAIN 01/09/2010   DYSHIDROTIC ECZEMA 08/10/2009   SCLERODERMA 12/22/2008   Past Medical History:  Diagnosis Date   Gangrene (HCC) 07/11/2020   toes on left foot   Scleroderma (HCC)    Dr Norina Buzzard    Family History  Problem Relation Age of Onset   Heart attack Father        early 31's   Breast cancer Mother    Diabetes Maternal Grandmother     Past Surgical History:  Procedure Laterality Date   CERVICAL DISC SURGERY  2005   Social History   Occupational History   Occupation: works form home    Employer: [employer]  Tobacco Use   Smoking status: Former    Packs/day: 0.50    Years: 30.00    Pack years: 15.00    Types: Cigarettes    Quit date: 04/2020    Years since quitting: 0.5   Smokeless tobacco: Never  Vaping Use   Vaping Use: Never used  Substance and Sexual Activity   Alcohol use: No   Drug use: No   Sexual activity: Not on file

## 2020-11-03 ENCOUNTER — Ambulatory Visit (INDEPENDENT_AMBULATORY_CARE_PROVIDER_SITE_OTHER): Payer: BLUE CROSS/BLUE SHIELD | Admitting: Orthopedic Surgery

## 2020-11-03 DIAGNOSIS — I96 Gangrene, not elsewhere classified: Secondary | ICD-10-CM

## 2020-11-10 ENCOUNTER — Encounter: Payer: Self-pay | Admitting: Orthopedic Surgery

## 2020-11-10 NOTE — Progress Notes (Signed)
Office Visit Note   Patient: Kristen Fletcher           Date of Birth: 09/22/1965           MRN: [id number] Visit Date: 11/03/2020              Requested by: No referring provider defined for this encounter. PCP: Pcp, No  Chief Complaint  Patient presents with   Left Foot - Follow-up, Wound Check      HPI: Patient is a 55 year old woman who presents in follow-up for ischemic gangrenous changes to the toes left foot.  Patient feels like she is seeing an improvement she has been on nitroglycerin and Trental and doxycycline.  Assessment & Plan: Visit Diagnoses:  1. Gangrene of toe (HCC)     Plan: Continue with current treatment we will get her into a postoperative shoe.  Follow-Up Instructions: Return in about 3 weeks (around 11/24/2020).   Ortho Exam  Patient is alert, oriented, no adenopathy, well-dressed, normal affect, normal respiratory effort. Examination all the toes show improvement there is still a large ischemic ulcer on the plantar aspect of the left little toe there is no drainage no cellulitis no sausage digit swelling she does have pain.  Imaging: No results found. No images are attached to the encounter.  Labs: Lab Results  Component Value Date   HGBA1C 5.8 (H) 07/08/2020   ESRSEDRATE 22 07/08/2020   CRP 0.8 07/08/2020   REPTSTATUS 07/13/2020 FINAL 07/07/2020   CULT  07/07/2020    NO GROWTH 5 DAYS Performed at [facility], [address], [place], [place] [address]      Lab Results  Component Value Date   ALBUMIN 3.2 (L) 07/07/2020   ALBUMIN 4.1 08/04/2014   ALBUMIN 4.2 07/06/2014   PREALBUMIN 15.3 (L) 07/08/2020    No results found for: MG Lab Results  Component Value Date   VD25OH 41 09/22/2013   VD25OH 22 (L) 09/24/2011    Lab Results  Component Value Date   PREALBUMIN 15.3 (L) 07/08/2020   CBC EXTENDED Latest Ref Rng & Units 07/10/2020 07/07/2020 09/22/2013  WBC 4.0 - 10.5 K/uL 8.6 8.9 7.4  RBC 3.87 - 5.11 MIL/uL 4.36 4.93  5.53(H)  HGB 12.0 - 15.0 g/dL 85.2 77.8 16.3(H)  HCT 36.0 - 46.0 % 38.1 43.7 48.4(H)  PLT 150 - 400 K/uL 247 272 251  NEUTROABS 1.7 - 7.7 K/uL - 6.0 -  LYMPHSABS 0.7 - 4.0 K/uL - 1.9 -     There is no height or weight on file to calculate BMI.  Orders:  No orders of the defined types were placed in this encounter.  No orders of the defined types were placed in this encounter.    Procedures: No procedures performed  Clinical Data: No additional findings.  ROS:  All other systems negative, except as noted in the HPI. Review of Systems  Objective: Vital Signs: There were no vitals taken for this visit.  Specialty Comments:  No specialty comments available.  PMFS History: Patient Active Problem List   Diagnosis Date Noted   Gangrene of toe (HCC) 07/08/2020   Primary osteoarthritis of left foot 07/07/2014   Other and unspecified hyperlipidemia 09/24/2011   STRESS REACTION, ACUTE 01/09/2010   ARM PAIN 01/09/2010   DYSHIDROTIC ECZEMA 08/10/2009   SCLERODERMA 12/22/2008   Past Medical History:  Diagnosis Date   Gangrene (HCC) 07/11/2020   toes on left foot   Scleroderma (HCC)    Dr Norina Buzzard  Family History  Problem Relation Age of Onset   Heart attack Father        early 58's   Breast cancer Mother    Diabetes Maternal Grandmother     Past Surgical History:  Procedure Laterality Date   CERVICAL DISC SURGERY  2005   Social History   Occupational History   Occupation: works form home    Employer: [employer]  Tobacco Use   Smoking status: Former    Packs/day: 0.50    Years: 30.00    Pack years: 15.00    Types: Cigarettes    Quit date: 04/2020    Years since quitting: 0.5   Smokeless tobacco: Never  Vaping Use   Vaping Use: Never used  Substance and Sexual Activity   Alcohol use: No   Drug use: No   Sexual activity: Not on file

## 2020-11-24 ENCOUNTER — Ambulatory Visit (INDEPENDENT_AMBULATORY_CARE_PROVIDER_SITE_OTHER): Payer: BLUE CROSS/BLUE SHIELD | Admitting: Orthopedic Surgery

## 2020-11-24 ENCOUNTER — Other Ambulatory Visit: Payer: Self-pay

## 2020-11-24 DIAGNOSIS — I96 Gangrene, not elsewhere classified: Secondary | ICD-10-CM | POA: Diagnosis not present

## 2020-11-24 MED ORDER — FLUCONAZOLE 150 MG PO TABS: 150.0000 mg | ORAL_TABLET | Freq: Once | ORAL | 0 refills | Status: AC

## 2020-11-24 MED ORDER — GABAPENTIN 300 MG PO CAPS: 300.0000 mg | ORAL_CAPSULE | Freq: Three times a day (TID) | ORAL | 3 refills | Status: AC

## 2020-12-06 ENCOUNTER — Encounter: Payer: Self-pay | Admitting: Orthopedic Surgery

## 2020-12-06 ENCOUNTER — Other Ambulatory Visit: Payer: Self-pay | Admitting: Orthopedic Surgery

## 2020-12-06 NOTE — Progress Notes (Signed)
Office Visit Note   Patient: Kristen Fletcher           Date of Birth: 06-17-1965           MRN: [id number] Visit Date: 11/24/2020              Requested by: No referring provider defined for this encounter. PCP: Pcp, No  Chief Complaint  Patient presents with   Left Foot - Follow-up      HPI: Patient is a 55 year old woman who presents with ischemic gangrenous toes on the left foot.  Patient is currently using a nitroglycerin patch, Trental, and doxycycline.  Assessment & Plan: Visit Diagnoses:  1. Gangrene of toe (HCC)     Plan: Patient is given a prescription for Neurontin as well as a prescription for fluconazole.  She will continue with the nitroglycerin and Trental.  Follow-Up Instructions: Return in about 4 weeks (around 12/22/2020).   Ortho Exam  Patient is alert, oriented, no adenopathy, well-dressed, normal affect, normal respiratory effort. Examination the ischemic gangrenous changes to his toes are is much improved there is improved new epithelialization around the ulcers the area of ischemic gangrenous changes has decreased there is no cellulitis no odor no drainage.  Patient states her pain has improved.  Imaging: No results found. No images are attached to the encounter.  Labs: Lab Results  Component Value Date   HGBA1C 5.8 (H) 07/08/2020   ESRSEDRATE 22 07/08/2020   CRP 0.8 07/08/2020   REPTSTATUS 07/13/2020 FINAL 07/07/2020   CULT  07/07/2020    NO GROWTH 5 DAYS Performed at [facility], [address] [place], [place] [address]      Lab Results  Component Value Date   ALBUMIN 3.2 (L) 07/07/2020   ALBUMIN 4.1 08/04/2014   ALBUMIN 4.2 07/06/2014   PREALBUMIN 15.3 (L) 07/08/2020    No results found for: MG Lab Results  Component Value Date   VD25OH 41 09/22/2013   VD25OH 22 (L) 09/24/2011    Lab Results  Component Value Date   PREALBUMIN 15.3 (L) 07/08/2020   CBC EXTENDED Latest Ref Rng & Units 07/10/2020 07/07/2020 09/22/2013   WBC 4.0 - 10.5 K/uL 8.6 8.9 7.4  RBC 3.87 - 5.11 MIL/uL 4.36 4.93 5.53(H)  HGB 12.0 - 15.0 g/dL 70.0 17.4 16.3(H)  HCT 36.0 - 46.0 % 38.1 43.7 48.4(H)  PLT 150 - 400 K/uL 247 272 251  NEUTROABS 1.7 - 7.7 K/uL - 6.0 -  LYMPHSABS 0.7 - 4.0 K/uL - 1.9 -     There is no height or weight on file to calculate BMI.  Orders:  No orders of the defined types were placed in this encounter.  Meds ordered this encounter  Medications   gabapentin (NEURONTIN) 300 MG capsule    Sig: Take 1 capsule (300 mg total) by mouth 3 (three) times daily. 3 times a day when necessary neuropathy pain    Dispense:  90 capsule    Refill:  3   fluconazole (DIFLUCAN) 150 MG tablet    Sig: Take 1 tablet (150 mg total) by mouth once for 1 dose.    Dispense:  3 tablet    Refill:  0     Procedures: No procedures performed  Clinical Data: No additional findings.  ROS:  All other systems negative, except as noted in the HPI. Review of Systems  Objective: Vital Signs: There were no vitals taken for this visit.  Specialty Comments:  No specialty comments available.  PMFS History: Patient Active Problem List   Diagnosis Date Noted   Gangrene of toe (HCC) 07/08/2020   Primary osteoarthritis of left foot 07/07/2014   Other and unspecified hyperlipidemia 09/24/2011   STRESS REACTION, ACUTE 01/09/2010   ARM PAIN 01/09/2010   DYSHIDROTIC ECZEMA 08/10/2009   SCLERODERMA 12/22/2008   Past Medical History:  Diagnosis Date   Gangrene (HCC) 07/11/2020   toes on left foot   Scleroderma (HCC)    Dr Norina Buzzard    Family History  Problem Relation Age of Onset   Heart attack Father        early 53's   Breast cancer Mother    Diabetes Maternal Grandmother     Past Surgical History:  Procedure Laterality Date   CERVICAL DISC SURGERY  2005   Social History   Occupational History   Occupation: works form home    Employer: [employer]  Tobacco Use   Smoking status: Former    Packs/day:  0.50    Years: 30.00    Pack years: 15.00    Types: Cigarettes    Quit date: 04/2020    Years since quitting: 0.6   Smokeless tobacco: Never  Vaping Use   Vaping Use: Never used  Substance and Sexual Activity   Alcohol use: No   Drug use: No   Sexual activity: Not on file

## 2020-12-22 ENCOUNTER — Ambulatory Visit: Payer: BLUE CROSS/BLUE SHIELD | Admitting: Orthopedic Surgery

## 2021-10-05 IMAGING — CR DG FOOT COMPLETE 3+V*L*
3 series · 3 of 3 positions shown · non-contrast
Comparison: July 06, 2014

CLINICAL DATA: Pain with discoloration distally

EXAM:
LEFT FOOT - COMPLETE 3+ VIEW

[foot ap]
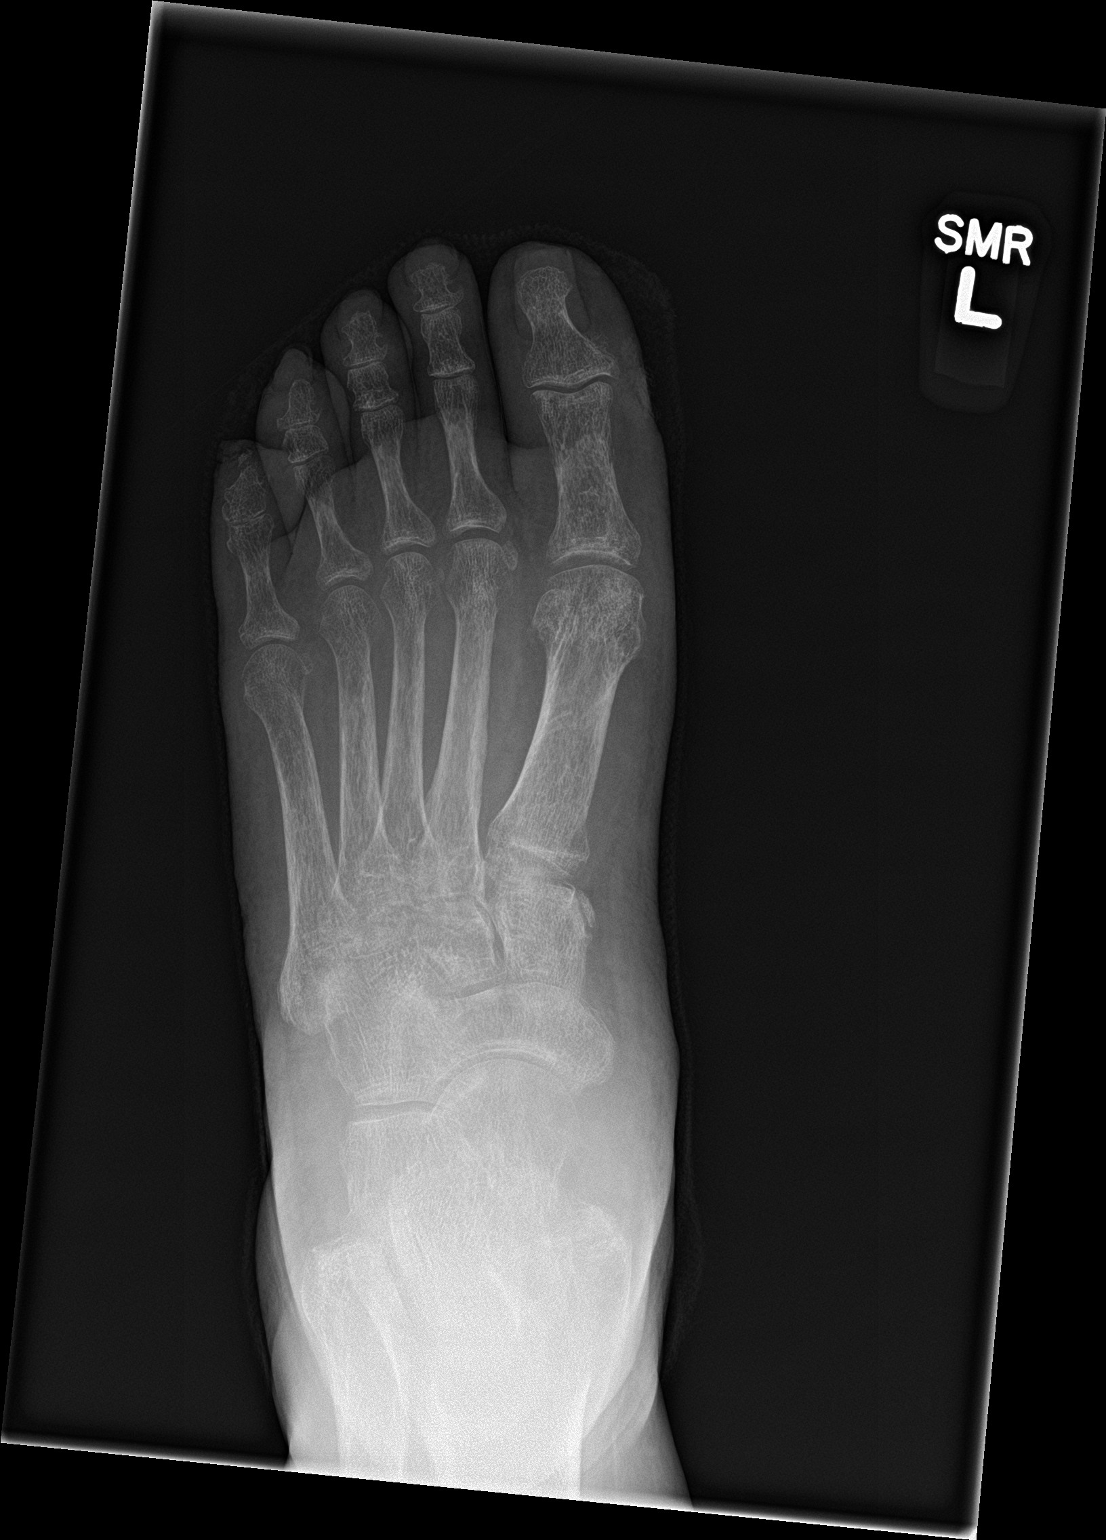

[foot obl]
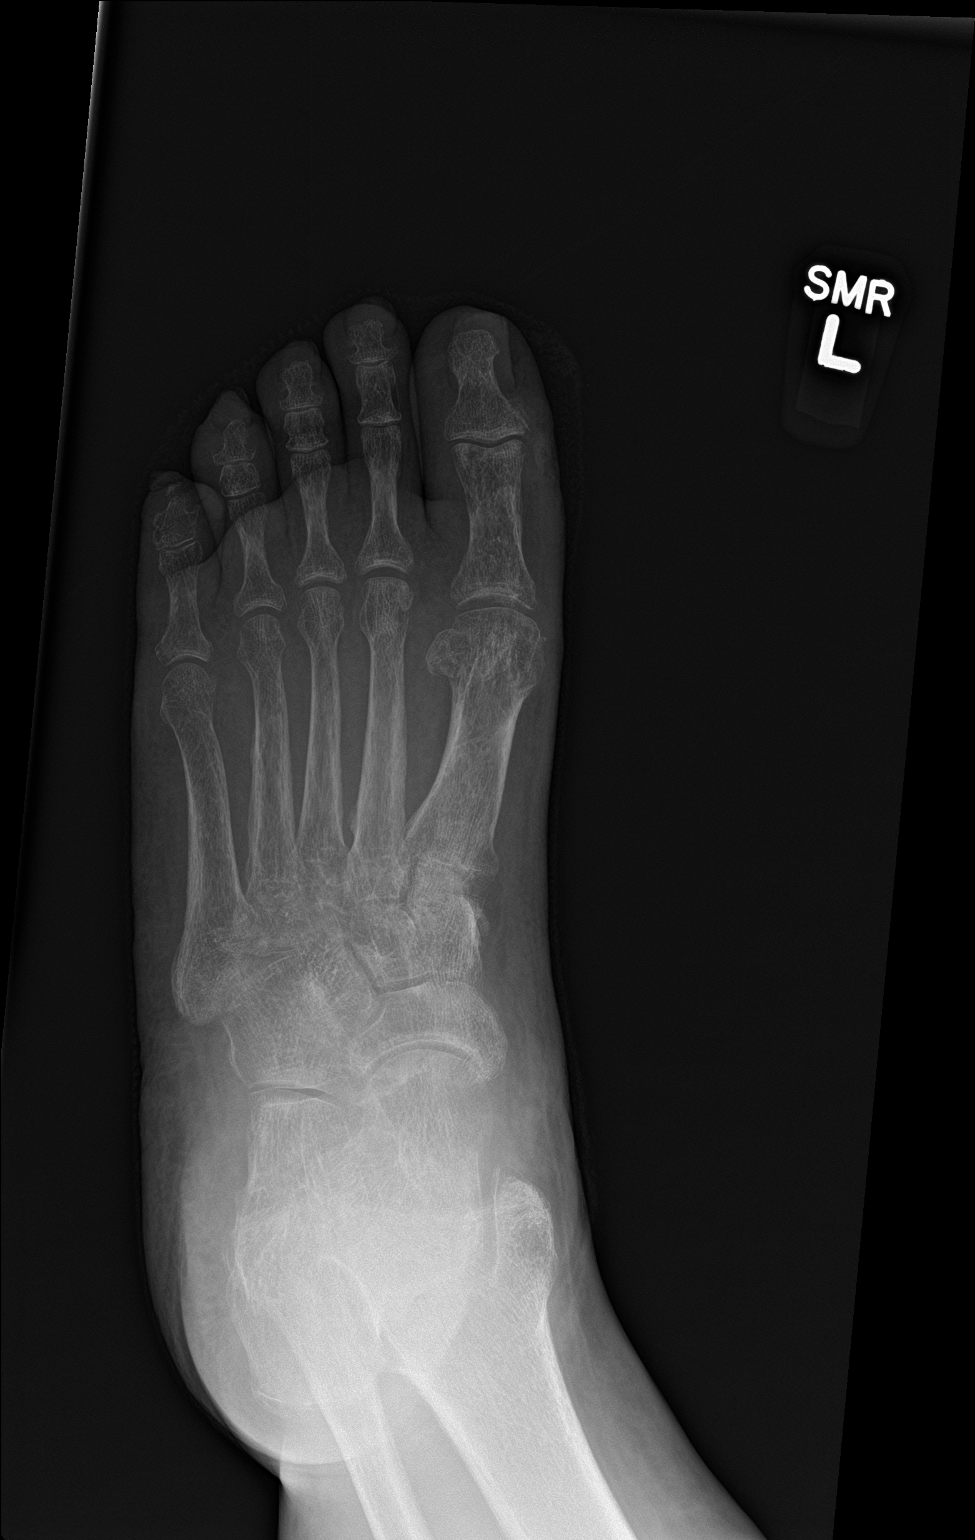

[foot lat]
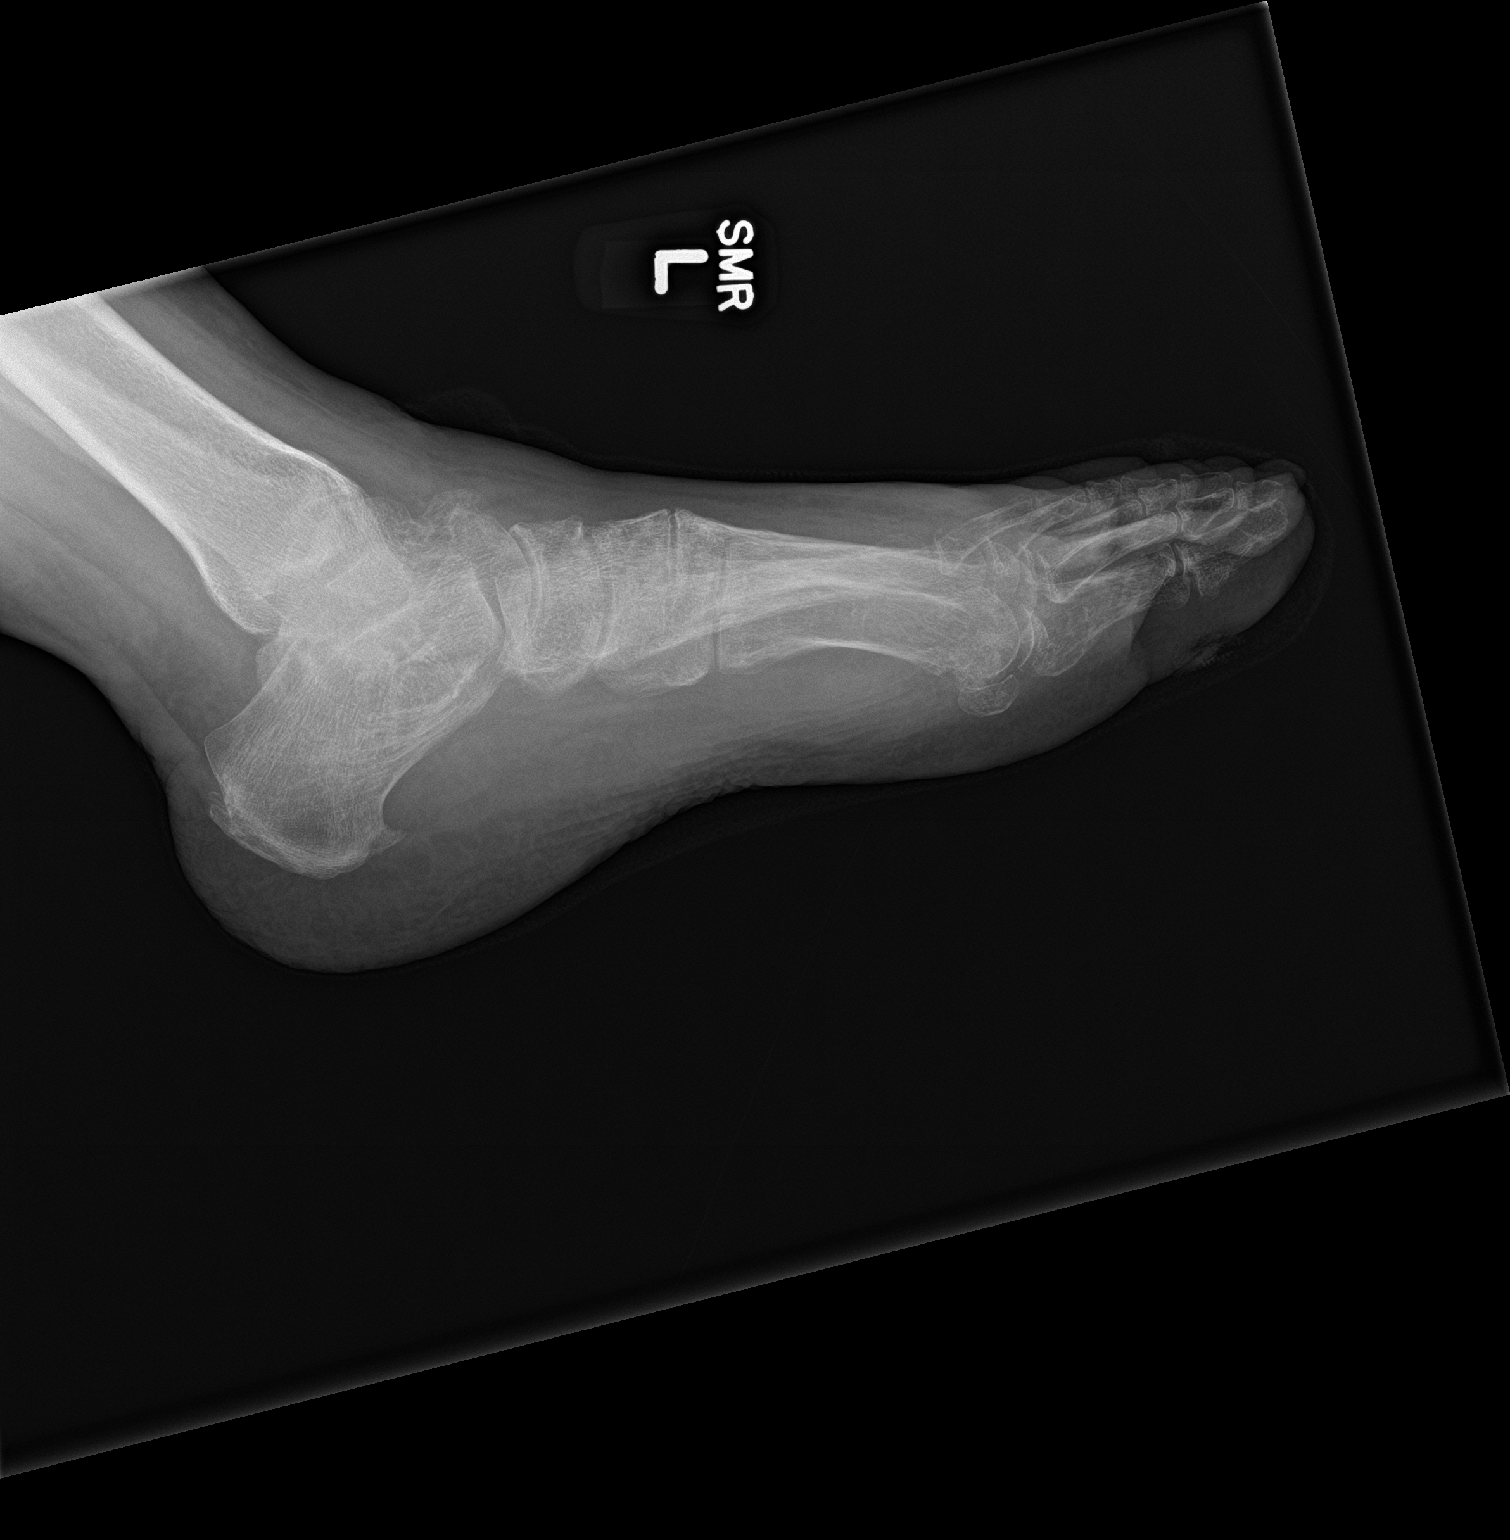

[3 of 3 positions shown; findings below may reference images not displayed]

FINDINGS: Frontal, oblique, and lateral views were obtained. Bones are
diffusely osteoporotic. No acute fracture or dislocation. Joint
spaces appear unremarkable. No erosive change or bony destruction.
There are small posterior and inferior calcaneal spurs. There is a
stable exostosis along the dorsal distal talus. Benign calcification
is noted medial to the medial cuneiform bone distally, stable.
IMPRESSION: Diffuse osteoporosis. No bony destruction or erosion. No fracture or
dislocation. No appreciable joint space narrowing. Small calcaneal
spurs noted.

## 2022-06-07 ENCOUNTER — Other Ambulatory Visit (HOSPITAL_COMMUNITY): Payer: Self-pay

## 2022-12-14 ENCOUNTER — Other Ambulatory Visit (HOSPITAL_BASED_OUTPATIENT_CLINIC_OR_DEPARTMENT_OTHER): Payer: Self-pay

## 2023-05-08 ENCOUNTER — Other Ambulatory Visit (HOSPITAL_BASED_OUTPATIENT_CLINIC_OR_DEPARTMENT_OTHER): Payer: Self-pay

## 2023-11-22 ENCOUNTER — Other Ambulatory Visit (HOSPITAL_BASED_OUTPATIENT_CLINIC_OR_DEPARTMENT_OTHER): Payer: Self-pay
